# Patient Record
Sex: Female | Born: 1985 | Hispanic: Yes | Marital: Married | Smoking: Never smoker
Health system: Southern US, Community
[De-identification: ages and names within clinical notes are randomized; demographics above are authoritative.]

## PROBLEM LIST (undated history)

## (undated) DIAGNOSIS — K37 Unspecified appendicitis: Secondary | ICD-10-CM

## (undated) HISTORY — DX: Unspecified appendicitis: K37

---
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
## 2014-10-11 DIAGNOSIS — L709 Acne, unspecified: Secondary | ICD-10-CM

## 2014-10-11 DIAGNOSIS — H52211 Irregular astigmatism, right eye: Secondary | ICD-10-CM | POA: Insufficient documentation

## 2014-10-11 HISTORY — DX: Acne, unspecified: L70.9

## 2015-02-08 DIAGNOSIS — R946 Abnormal results of thyroid function studies: Secondary | ICD-10-CM

## 2015-02-08 HISTORY — DX: Abnormal results of thyroid function studies: R94.6

## 2015-04-15 DIAGNOSIS — H5203 Hypermetropia, bilateral: Secondary | ICD-10-CM | POA: Insufficient documentation

## 2015-04-15 DIAGNOSIS — H5310 Unspecified subjective visual disturbances: Secondary | ICD-10-CM

## 2015-04-15 HISTORY — DX: Hypermetropia, bilateral: H52.03

## 2015-04-15 HISTORY — DX: Unspecified subjective visual disturbances: H53.10

## 2015-05-10 DIAGNOSIS — D171 Benign lipomatous neoplasm of skin and subcutaneous tissue of trunk: Secondary | ICD-10-CM

## 2015-05-10 DIAGNOSIS — L918 Other hypertrophic disorders of the skin: Secondary | ICD-10-CM | POA: Insufficient documentation

## 2015-05-10 HISTORY — DX: Other hypertrophic disorders of the skin: L91.8

## 2015-05-10 HISTORY — DX: Benign lipomatous neoplasm of skin and subcutaneous tissue of trunk: D17.1

## 2015-05-16 DIAGNOSIS — H53009 Unspecified amblyopia, unspecified eye: Secondary | ICD-10-CM | POA: Insufficient documentation

## 2015-09-06 DIAGNOSIS — K644 Residual hemorrhoidal skin tags: Secondary | ICD-10-CM

## 2015-09-06 HISTORY — DX: Residual hemorrhoidal skin tags: K64.4

## 2016-03-14 DIAGNOSIS — H52222 Regular astigmatism, left eye: Secondary | ICD-10-CM | POA: Insufficient documentation

## 2017-06-18 ENCOUNTER — Other Ambulatory Visit: Payer: Self-pay

## 2017-06-18 ENCOUNTER — Ambulatory Visit (INDEPENDENT_AMBULATORY_CARE_PROVIDER_SITE_OTHER): Payer: Medicaid Other | Admitting: Family Medicine

## 2017-06-18 VITALS — BP 96/68 | HR 87 | Temp 98.7°F | Ht 68.0 in | Wt 146.0 lb

## 2017-06-18 DIAGNOSIS — Z7689 Persons encountering health services in other specified circumstances: Secondary | ICD-10-CM

## 2017-06-18 NOTE — Progress Notes (Signed)
   Subjective:   Patient ID: Margaret [name]    DOB: [birth year], 32 y.o. female   MRN: [id number]  CC: new patient   HPI: Margaret [name] is a 32 y.o. female who presents to clinic today to establish care with pcp.    Patient has moved to [place] from [place].  She lives at home with her two children, (ages 94 and 62).   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  No current concerns at this visit.    Seizure disorder:  Diagnosed 2012. She takes Lamictal 150 mg BID.  Her Neurologist is in [place], has not yet established with one here.  She has not had any episodes since starting medication.    -PMH: seizure disorder -Surg Hx: appendectomy 2001  -FHx:  Mother: diabetes, HTN.  Father: HTN, HLD and ?kidney disease.  Grandparent with h/o hepatitis C, depression, HTN, thyroid disease, and T2DM.   -Social Hx: No tobacco use, denies alcohol or illicit drug use.  -Meds: Lamictal   Allergies: no known    ROS: Denies fever, chills, nausea, vomiting.  No SOB, CP, palpitations, leg swelling  Social: pt is a never smoker  Medications reviewed.  Objective:   BP 96/68   Pulse 87   Temp 98.7 F (37.1 C) (Oral)   Ht 5\' 8"  (1.727 m)   Wt 146 lb (66.2 kg)   LMP 05/22/2017   SpO2 99%   BMI 22.20 kg/m  Vitals and nursing note reviewed.  General: well-appearing 33 yo female, NAD  HEENT: NCAT, EOMI, PERRL  Neck: supple, nontender, no JVD  CV: RRR no MRG  Lungs: CTAB normal effort  Abdomen: soft, NTND, +bs  Skin: warm, dry, no rash  Extremities: warm and well perfused   Assessment & Plan:   32 yo female presenting to establish care.  Past medical, family history and surgical history reviewed.  Medication list reviewed and health maintenance issues discussed.   She is to follow up in 1-2 weeks to address additional concern.    Health Maintenance:  -Last Pap 2018- normal; no h/o abnormal paps in the past  -HIV declined, had it checked in [place] 2012 and negative  -received  her tetanus shot last month due to immigration    Follow-up:  1-2 weeks   Lovenia Kim, MD [place], PGY-2 06/24/2017 2:38 PM

## 2017-06-18 NOTE — Patient Instructions (Signed)
It was nice meeting you today.   You were seen in clinic to establish care with me as your primary care provider.  We reviewed your medical history, surgical and family history as well as medications.  We also discussed health maintenance issues such as Pap smear and screening tests.  Please make an appointment in about a week or so to address additional concerns.  Be well, Lovenia Kim MD

## 2017-06-28 ENCOUNTER — Ambulatory Visit: Payer: Self-pay | Admitting: Family Medicine

## 2017-07-09 ENCOUNTER — Ambulatory Visit: Payer: Self-pay | Admitting: Family Medicine

## 2017-07-09 NOTE — Progress Notes (Deleted)
   Subjective:   Patient ID: Margaret [name]    DOB: [birth year], 32 y.o. female   MRN: [id number]  CC: ***  HPI: Margaret [name] is a 32 y.o. female who presents to clinic today ***. Problems discussed today are as follows:  ROS: See HPI for pertinent ROS.  [place]: Pertinent past medical, surgical, family, and social history were reviewed and updated as appropriate. Smoking status reviewed.  Medications reviewed.  Objective:   There were no vitals taken for this visit. Vitals and nursing note reviewed.  General: well nourished, well developed, in no acute distress with non-toxic appearance HEENT: normocephalic, atraumatic, moist mucous membranes Neck: supple, non-tender without lymphadenopathy CV: regular rate and rhythm without murmurs rubs or gallops Lungs: clear to auscultation bilaterally with normal work of breathing Abdomen: soft, non-tender, no masses or organomegaly palpable, normoactive bowel sounds Skin: warm, dry, no rashes or lesions, cap refill < 2 seconds Extremities: warm and well perfused, normal tone  Assessment & Plan:   No problem-specific Assessment & Plan notes found for this encounter.  No orders of the defined types were placed in this encounter.  No orders of the defined types were placed in this encounter.   Lovenia Kim, MD [place], PGY-2 07/09/2017 11:30 AM

## 2017-07-23 ENCOUNTER — Ambulatory Visit (INDEPENDENT_AMBULATORY_CARE_PROVIDER_SITE_OTHER): Payer: Medicaid Other | Admitting: Family Medicine

## 2017-07-23 ENCOUNTER — Encounter: Payer: Self-pay | Admitting: Family Medicine

## 2017-07-23 DIAGNOSIS — M25562 Pain in left knee: Secondary | ICD-10-CM | POA: Diagnosis present

## 2017-07-23 MED ORDER — NAPROXEN 250 MG PO TABS: 250.0000 mg | ORAL_TABLET | Freq: Two times a day (BID) | ORAL | 0 refills | Status: DC

## 2017-07-23 NOTE — Assessment & Plan Note (Signed)
Currently asymptomatic.  Patient reports clicking occurs mainly during winter months.  No swelling, erythema, or obvious joint deformity on exam. No suspicion for meniscal tear given negative McMurrays and otherwise normal MSK exam.  Low suspicion for fracture given no history of trauma.  Recommend anti-inflammatory, rest and elevation, ice application.  Will defer imaging for now as do not expect osteoarthritis given her age and no recent knee or other comorbidities. Discussed with pt and she is agreeable to conservative management.  Will continue to monitor.

## 2017-07-23 NOTE — Patient Instructions (Addendum)
It was nice seeing you again today!  You were seen in clinic for left knee pain.  As we discussed, I do not suspect arthritis given your age and otherwise healthy medical history. I also do not suspect a meniscal or ligament tear as your physical exam was normal.   I would recommend continuing to use a compression sleeve over the left knee, applying ice, elevating the knee and taking anti-inflammatories as needed.  I have prescribed you Naprosyn and you can take this twice a day as needed.  This should not prevent you from any activity and you can continue to exercise as usual.  Please call clinic if you have any questions.  We will follow up with you in 4-6 weeks or sooner if needed.   Be well, Lovenia Kim MD

## 2017-07-23 NOTE — Progress Notes (Signed)
   Subjective:   Patient ID: Margaret [name]    DOB: 1986-01-20, 32 y.o. female   MRN: [id number]  CC: L knee pain   HPI: Margaret [name] is a 32 y.o. female who presents to clinic today for the following issue.  L knee pain Patient notes her left knee pain first started December/January 2019.  She denies history of fall or injury to the knee prior to onset but had been on her feet a lot for work around the time her pain started.  She works at [employer] and walks around all day for 2 months, October through December.  She is no longer working now but suspects her symptoms at the time were exacerbated with work.  She reports she hears a clicking sound.  No popping or cracking sensation and knee.  Usually happens in colder weather and is not currently bothering her as the weather is warmer.  No weakness noted she does not feel like her knee gives out.  She has not previously had any issues with her knees or other joints.  She has not been taking any anti-inflammatories for the pain.  She uses a compression sleeve which helped.  No ice or heat application.  No leg swelling or stiffness reported.  No fever, chills, nausea, vomiting.  No numbness or tingling.  No weakness noted.   ROS: See HPI for pertinent ROS.  Social: Patient is a never smoker Medications reviewed. Objective:   BP 95/70 (BP Location: Left Arm, Patient Position: Sitting, Cuff Size: Normal)   Pulse 77   Temp 98.5 F (36.9 C) (Oral)   Ht 5\' 8"  (1.727 m)   Wt 148 lb 3.2 oz (67.2 kg)   SpO2 99%   BMI 22.53 kg/m  Vitals and nursing note reviewed.  General: 32 year old female, NAD CV: RRR no MRG, 2+ pedal pulses  Lungs: CTA B, normal effort Skin: warm, dry, no rash Extremities: warm and well perfused, normal tone  MSK - Left knee: Normal to inspection with no erythema or effusion or obvious bony abnormalities. Palpation normal with no warmth or joint line tenderness or patellar tenderness or condyle tenderness. ROM normal in flexion  and extension and lower leg rotation. Ligaments with solid consistent endpoints including ACL, PCL, LCL, MCL. Negative Mcmurray's and provocative meniscal tests. Non painful patellar compression. Patellar and quadriceps tendons unremarkable. Hamstring and quadriceps strength is normal.  Assessment & Plan:   Left knee pain Currently asymptomatic.  Patient reports clicking occurs mainly during winter months.  No swelling, erythema, or obvious joint deformity on exam. No suspicion for meniscal tear given negative McMurrays and otherwise normal MSK exam.  Low suspicion for fracture given no history of trauma.  Recommend anti-inflammatory, rest and elevation, ice application.  Will defer imaging for now as do not expect osteoarthritis given her age and no recent knee or other comorbidities. Discussed with pt and she is agreeable to conservative management.  Will continue to monitor.    Meds ordered this encounter  Medications  . naproxen (NAPROSYN) 250 MG tablet    Sig: Take 1 tablet (250 mg total) by mouth 2 (two) times daily with a meal.    Dispense:  60 tablet    Refill:  0    Lovenia Kim, MD [place], PGY-2 07/23/2017 5:01 PM

## 2017-07-30 ENCOUNTER — Ambulatory Visit: Payer: Medicaid Other | Admitting: Internal Medicine

## 2017-07-30 ENCOUNTER — Other Ambulatory Visit: Payer: Self-pay

## 2017-07-30 ENCOUNTER — Encounter: Payer: Self-pay | Admitting: Internal Medicine

## 2017-07-30 VITALS — BP 106/70 | HR 78 | Temp 98.0°F | Wt 144.0 lb

## 2017-07-30 DIAGNOSIS — G40909 Epilepsy, unspecified, not intractable, without status epilepticus: Secondary | ICD-10-CM | POA: Insufficient documentation

## 2017-07-30 DIAGNOSIS — B349 Viral infection, unspecified: Secondary | ICD-10-CM

## 2017-07-30 MED ORDER — IBUPROFEN 600 MG PO TABS: 600.0000 mg | ORAL_TABLET | Freq: Three times a day (TID) | ORAL | 0 refills | Status: DC | PRN

## 2017-07-30 NOTE — Progress Notes (Signed)
   [facility] Phone: [phone]   Date of Visit: 07/30/2017   HPI:  Sore Throat:  - sore throat started Thursday night. She went to urgent care on Friday. Her strep test was negative there. She was given throat numbing medication which helped. Her symptoms improved but worsened yesterday. The numbing medication still helps some - she also feels tired and dizzy when going from a seated to a standing position. She is a cough with yellow sputum that improves with over the counter cough medications.   - she has been eating snacks through out the day. Had not been drinking as much fluid.  - reports ibuprofen helps with the sore throat as well.  - denies having fevers  - reports her children have been sick with fever for the past three days but have not complained about sore throat.   ROS: See HPI.  [place]:  PMH: Seizure disorder   PHYSICAL EXAM: BP 106/70   Pulse 78   Temp 98 F (36.7 C) (Oral)   Wt 144 lb (65.3 kg)   LMP 07/24/2017   SpO2 98%   BMI 21.90 kg/m  GEN: NAD HEENT: neck supple without lymphadenopathy , EOMI, sclera clear, pharynx with mild erythema but no asymmetry or exudates, uvula is midline.  CV: RRR, no murmurs, rubs, or gallops PULM: CTAB, normal effort SKIN: No rash or cyanosis; warm and well-perfused EXTR: No lower extremity edema or calf tenderness PSYCH: Mood and affect euthymic, normal rate and volume of speech NEURO: Awake, alert, no focal deficits grossly, normal speech  ASSESSMENT/PLAN:  1. Viral syndrome Symptoms are likely due to viral infection, likely Infectious Mononucleosis. No signs of bacterial infection/pneumonia. Vitals are stable and patient is afebrile. Discussed symptomatic management and return precautions.   Smiley Houseman, MD PGY [place]

## 2017-07-30 NOTE — Patient Instructions (Signed)
I think your symptoms are still consistent with a virus, possibly infectious mononucleosis. Keep yourself hydrated.

## 2017-07-31 ENCOUNTER — Encounter: Payer: Self-pay | Admitting: Internal Medicine

## 2017-09-12 ENCOUNTER — Other Ambulatory Visit: Payer: Self-pay | Admitting: Internal Medicine

## 2018-04-23 ENCOUNTER — Ambulatory Visit: Payer: Medicaid Other | Admitting: Family Medicine

## 2019-04-24 ENCOUNTER — Encounter: Payer: Self-pay | Admitting: Family Medicine

## 2019-04-24 ENCOUNTER — Other Ambulatory Visit: Payer: Self-pay

## 2019-04-24 ENCOUNTER — Ambulatory Visit (INDEPENDENT_AMBULATORY_CARE_PROVIDER_SITE_OTHER): Payer: Medicaid Other | Admitting: Family Medicine

## 2019-04-24 VITALS — BP 100/80 | HR 89 | Wt 155.2 lb

## 2019-04-24 DIAGNOSIS — F329 Major depressive disorder, single episode, unspecified: Secondary | ICD-10-CM

## 2019-04-24 DIAGNOSIS — R5383 Other fatigue: Secondary | ICD-10-CM

## 2019-04-24 DIAGNOSIS — F32A Depression, unspecified: Secondary | ICD-10-CM

## 2019-04-24 DIAGNOSIS — F419 Anxiety disorder, unspecified: Secondary | ICD-10-CM | POA: Diagnosis not present

## 2019-04-24 NOTE — Progress Notes (Signed)
    SUBJECTIVE:   CHIEF COMPLAINT / HPI: Clearance for tooth extraction  Tooth extraction: Reports she needs her for posterior molars removed, will be done under local anesthesia.  She want to make sure that this was okay due to her history of epilepsy which is well controlled on lamotrigine.  Has not had a seizure since 2012.  Has had no difficulties with anesthesia in the past.  No cardiac history or prosthetic valves.  Feeling down: Reports she has noticed for the last several weeks that she has a shorter fuse with things that irritate her.  Noticing this all the time, however worse the week before her menstrual cycle.  She used to exercise before the pandemic however has not since the closing of gyms.  Feels like she does not have much time to herself, all of her time was devoted to her 2 children and husband.  She is feeling down and fatigued, not wanting to do the things she previously did.  Reports nothing in particular has happened recently, does feel down in general due to the duration of the pandemic.  Feels safe at home.  Would like a medication to relax her.  PHQ-9 score of 9, no SI or HI GAD-7 score of 7 Both somewhat difficult for her to handle.  Health maintenance: Due for HIV screening, Tdap, flu shot, and Pap smear  PERTINENT  PMH / PSH: Seizure disorder  OBJECTIVE:   BP 100/80   Pulse 89   Wt 155 lb 3.2 oz (70.4 kg)   SpO2 100%   BMI 23.60 kg/m   General: Alert, NAD HEENT: NCAT, MMM Cardiac: RRR no m/g/r Lungs: Clear bilaterally, no increased WOB  Msk: Moves all extremities spontaneously  Ext: Warm, dry Psych: Melancholic mood and affect.  Able to hold normal conversation, nontangential.  ASSESSMENT/PLAN:   Anxiety and depression Mild, may also have component of premenstrual syndrome due to worsening the week prior to menses.  Discussed starting counseling which the patient is receptive to, provided with resources.  Additionally discussed trying to stay active,  following a well-balanced diet, and finding an outlet to enjoy some alone time.  Obtaining a TSH and CBC to ensure thyroid dysfunction or anemia is not contributing given her previous history.  Will have her follow-up in approximately 2 weeks to check in, will discuss further relaxation techniques at that time.  Could consider medications in the future if not improving.    Tooth extraction clearance Cleared from a medical perspective for tooth extraction.  Will send letter to her dentist to proceed without any prophylactic antibiotics.  Healthcare maintenance: Reports she had a Pap smear 1 year ago in [place] that was normal.  She will be getting these records sent to [place].  Follow-up in 2 weeks or sooner if needed.  [name], [place]

## 2019-04-24 NOTE — Patient Instructions (Addendum)
It was so wonderful meeting you today.  Below are some therapy resources that you can get connected with to help with how you are feeling.  We are going to go ahead and check your blood level and TSH today just to make sure these are not contributing.    Therapy and Counseling Resources Most providers on this list will take Medicaid. Patients with commercial insurance or Medicare should contact their insurance company to get a list of in network providers.  [facility]  [address], [place], [place] [address]      [phone]  [place] [address] [place], [place] [address]       [place] [address] [place], [place]    [facility] [address] [place], [place]  [facility]:  [place]. [place]  [facility]:  [place] [address] Loni Muse, [place]  [facility] (under & uninsured) [address], [place] [phone]    [email or web address]     Phone:  [phone]     [facility]  [place] [address]      [place], [place] ext [place]: [place], [place], [place]   [place] ([place] therapist) [address] [place] [address]   [place] [address]    [phone]    [facility]   [place],  [place], [place]   [place]  [place]  [facility]  [address] [place], [place]        [phone]  Open Access/Walk In Clinic under & uninsured [place],  [address], [place] [phone]):  Mon - Fri from 8 AM - 3 PM  [facility]  Service of the [place],  ([place])   [place], [place]: [phone]) 8:30 - 12; 1 - 2:30  Family Service of the [place],  [place], [place]    ([phone]):8:30 - 12; 2 - 3PM  [facility],  [address],  [place]; [phone]):   Mon - Fri 8 AM - 5 PM  [facility] [place]  MWF 12:30 to 3:00 or call to schedule an appointment  [phone]  Specific Provider options Psychology Today  [email or web address].com/us 1. click on find a therapist  2. enter your zip code 3. left side and select or tailor a therapist for your specific need.   [facility] Provider Directory [email or web address]  (Medicaid)   Follow all drop down to find a provider  [place] or [email or web address] [address], [place], [place] Recovery support and educational   In home counseling [place] Telephone: [phone]  office in [facility] [email or web address]   Does not take reg. Medicaid or Medicare private insurance [facility], [place] health Choice, [facility], [place], [place], [place], [place] Health Choice  24- Hour Availability:  . [place] or [phone]  . Family Service of the [employer] [phone]  [facility] Crisis  Service  [phone]   . [place]  [phone] (after hours)  . Therapeutic Alternative/Mobile Crisis   [phone]  . [place] National Suicide Hotline  920-353-0353 ([place])  . Call 911 or go to emergency room  . [employer]  [phone]);  [place] and [employer]   . [facility] ACCESS  [phone]); [place], [place], [place], [place], [place], [place], [place]

## 2019-04-24 NOTE — Assessment & Plan Note (Addendum)
Mild, may also have component of premenstrual syndrome due to worsening the week prior to menses.  Discussed starting counseling which the patient is receptive to, provided with resources.  Additionally discussed trying to stay active, following a well-balanced diet, and finding an outlet to enjoy some alone time.  Obtaining a TSH and CBC to ensure thyroid dysfunction or anemia is not contributing given her previous history.  Will have her follow-up in approximately 2 weeks to check in, will discuss further relaxation techniques at that time.  Could consider medications in the future if not improving.

## 2019-04-25 LAB — CBC
Hematocrit: 37.7 % (ref 34.0–46.6)
Hemoglobin: 12.5 g/dL (ref 11.1–15.9)
MCH: 28.7 pg (ref 26.6–33.0)
MCHC: 33.2 g/dL (ref 31.5–35.7)
MCV: 87 fL (ref 79–97)
Platelets: 264 10*3/uL (ref 150–450)
RBC: 4.35 x10E6/uL (ref 3.77–5.28)
RDW: 13.5 % (ref 11.7–15.4)
WBC: 5.4 10*3/uL (ref 3.4–10.8)

## 2019-04-25 LAB — TSH: TSH: 6.03 u[IU]/mL — ABNORMAL HIGH (ref 0.450–4.500)

## 2019-04-27 ENCOUNTER — Other Ambulatory Visit: Payer: Self-pay | Admitting: Family Medicine

## 2019-04-27 ENCOUNTER — Telehealth: Payer: Self-pay | Admitting: Family Medicine

## 2019-04-27 DIAGNOSIS — R7989 Other specified abnormal findings of blood chemistry: Secondary | ICD-10-CM

## 2019-04-27 NOTE — Telephone Encounter (Signed)
Pt is calling saying she is needing to schedule appt, but she cant because her clearance papers aren't signed and faxed over, she is saying she handed them to the doctor on Friday at appt. Please advise thanks.!

## 2019-04-28 ENCOUNTER — Encounter: Payer: Self-pay | Admitting: Family Medicine

## 2019-04-28 LAB — SPECIMEN STATUS REPORT

## 2019-04-28 LAB — T4, FREE: Free T4: 1.28 ng/dL (ref 0.82–1.77)

## 2019-04-28 NOTE — Telephone Encounter (Signed)
I faxed the clearance papers yesterday, 3/15. Please let the patient know she should be good to go to schedule an appointment.   Thank you! [name], DO

## 2019-05-01 NOTE — Telephone Encounter (Signed)
Called and LVM for patient informing her that clearance papers were faxed on 04/27/2019.  Ozella Almond, [place]

## 2019-06-19 ENCOUNTER — Encounter: Payer: Self-pay | Admitting: Family Medicine

## 2019-06-19 ENCOUNTER — Other Ambulatory Visit: Payer: Self-pay

## 2019-06-19 ENCOUNTER — Ambulatory Visit: Payer: Medicaid Other | Admitting: Family Medicine

## 2019-06-19 VITALS — BP 106/62 | HR 66 | Wt 152.2 lb

## 2019-06-19 DIAGNOSIS — Z3169 Encounter for other general counseling and advice on procreation: Secondary | ICD-10-CM

## 2019-06-19 DIAGNOSIS — G40909 Epilepsy, unspecified, not intractable, without status epilepticus: Secondary | ICD-10-CM | POA: Diagnosis not present

## 2019-06-19 NOTE — Patient Instructions (Signed)
It was wonderful to see you.  I have placed a referral for neurology and OB/GYN, you should hear from them within the next 1-2 weeks.  If you do not, please call the clinic and let us know.

## 2019-06-19 NOTE — Progress Notes (Signed)
    SUBJECTIVE:   CHIEF COMPLAINT / HPI: Discuss referral to neurologist  Margaret [name] is a 34 year old female presenting to discuss a neurology referral.  She has a history of epilepsy and has been on lamotrigine for several years.  Stable on this medication, has not had a seizure since 2012.  She has been following with her neurologist yearly in [place] (previously lived there) and feels that it is time to establish care with neurology locally instead.  During our conversation, also requests a referral to OB/GYN to discuss infertility.  States she and her husband have been "passively" trying for third child for over a year.  However, states she also is not sure if she wants another child unless it is 100% that she would have another son. She has taken ovulation kits at home which have confirmed that she is ovulating.  They have not been exclusively trying during ovulation phases. Having regular monthly cycles, usually last about a week.  Had no trouble with her first 2 children.  No history of tubal ligation or vasectomy.  No history of uterine instrumentation.  Health maintenance: Due for HIV screening.  Reports Pap smear in [place] within the last year, unsure at where to obtain records.   PERTINENT  PMH / PSH: Seizure disorder, anxiety/depression  OBJECTIVE:   BP 106/62   Pulse 66   Wt 152 lb 3.2 oz (69 kg)   LMP 06/18/2019   SpO2 99%   BMI 23.14 kg/m   General: Alert, NAD HEENT: NCAT, MMM Cardiac: RRR no m/g/r Lungs: No increased WOB  Msk: Moves all extremities spontaneously  Ext: Warm, dry   ASSESSMENT/PLAN:   Seizure disorder (HCC) Stable on lamotrigine, no seizure since 2012.  Will place non-urgent neurology referral per patient request to establish care locally.    Concern for infertility: G2P2 female, no previous difficulties with conception after >1 year.  Reports + ovulation via home kits.  Recent subclinical hypothyroidism on labs in 04/2019, doubt this is contributing.  During  conversation, patient reveals she is unsure if she wants further children, especially if it cannot be guaranteed that she will have a son.  Discussed having further conversation with her husband and will place a referral to OB/GYN in the meantime if they wish to proceed with more in-depth evaluation.  Educated on proper intercourse timing around ovulation.  F/u as needed, otherwise yearly.   Margaret [name], [place]

## 2019-06-22 ENCOUNTER — Encounter: Payer: Self-pay | Admitting: Family Medicine

## 2019-06-22 NOTE — Assessment & Plan Note (Signed)
Stable on lamotrigine, no seizure since 2012.  Will place non-urgent neurology referral per patient request to establish care locally.

## 2019-06-24 ENCOUNTER — Ambulatory Visit: Payer: Medicaid Other | Admitting: Neurology

## 2019-09-30 ENCOUNTER — Encounter: Payer: Self-pay | Admitting: Neurology

## 2019-09-30 ENCOUNTER — Other Ambulatory Visit: Payer: Self-pay

## 2019-09-30 ENCOUNTER — Ambulatory Visit (INDEPENDENT_AMBULATORY_CARE_PROVIDER_SITE_OTHER): Payer: Medicaid Other | Admitting: Neurology

## 2019-09-30 VITALS — BP 107/68 | HR 85 | Ht 68.0 in | Wt 155.0 lb

## 2019-09-30 DIAGNOSIS — R454 Irritability and anger: Secondary | ICD-10-CM | POA: Diagnosis not present

## 2019-09-30 DIAGNOSIS — G40309 Generalized idiopathic epilepsy and epileptic syndromes, not intractable, without status epilepticus: Secondary | ICD-10-CM

## 2019-09-30 MED ORDER — LAMOTRIGINE 150 MG PO TABS
150.0000 mg | ORAL_TABLET | Freq: Two times a day (BID) | ORAL | 3 refills | Status: DC
Start: 2019-09-30 — End: 2020-08-23

## 2019-09-30 NOTE — Progress Notes (Signed)
NEUROLOGY CONSULTATION NOTE  Ashyah Quizon MRN: [id number] DOB: August 12, 1985  Referring provider: Dr. Darrelyn Hillock Primary care provider: Dr. Darrelyn Hillock  Reason for consult:  Establish care for seizures  Dear Dr Higinio Plan:  Thank you for your kind referral of Rhyanna Sorce for consultation of the above symptoms. Although her history is well known to you, please allow me to reiterate it for the purpose of our medical record. She is alone in the office today. Records and images were personally reviewed where available.   HISTORY OF PRESENT ILLNESS: This is a 34 year old right-handed woman presenting to establish care for epilepsy. Seizures started in 2003/2004 while she was still living in [place]. There was no prior warning, she recalls cooking, then waking up on the bed. She was told she had full body shaking and was staring for a long time after. She slept all day afterwards. She was unable to seek medical care at that time. She had another seizure in 2006 while at [place] in [place], she was sitting then suddenly had a GTC. The next seizure occurred in 2010 when she was studying in the [place]. An EEG and MRI were recommended, she had these studies in [place] and was told MRI brain was fine but the EEG showed abnormal activity. She was started on Levetiracetam in [place] but had another seizure and was started on Lamotrigine 150mg  BID in 2012. She denies any further convulsions since then. No side effects on lamotrigine. She lives with her husband and 2 children (ages 21 and 27), and denies any staring/unresponsive episodes, gaps in time, olfactory/gustatory hallucinations. She has myoclonic jerks affecting her head when she is stressed out, sleep deprived, but most noticeable when she is concentrating a lot on her laptop. Extremities are not affected. She recalls prior to one of her seizures, she had a head jerk, then 5-10 seconds later she had a GTC and woke up in the hospital. Last time she recalls  having a head jerk was in Jan/Feb 2021. She denies any focal numbness/tingling/weakness. Once or twice she woke up with blood on her pillow, her husband has not mentioned any nocturnal convulsions. She has headaches around the time of her period. She has occasional neck pain, occasional constipation. She has noticed for the past couple of months, she gets angry easily. She has a history of being short-tempered but now little things happen and she screams at her children. Sleep is good. She denies any dizziness, diplopia, dysarthria/dysphagia, back pain, bladder dysfunction.She works as a Community education officer at [employer]. No current pregnancy plans, she is not on contraception.   Epilepsy Risk Factors:  Her maternal uncle and maternal first cousin have seizures. Otherwise she had a normal birth and early development.  There is no history of febrile convulsions, CNS infections such as meningitis/encephalitis, significant traumatic brain injury, neurosurgical procedures.  Prior AEDs: Levetiracetam  Diagnostic Data: Patient reports having a repeat abnormal EEG done in 2016, records will be requested from Dr. Pattricia Boss in [facility].   PAST MEDICAL HISTORY: Past Medical History:  Diagnosis Date   Appendicitis     PAST SURGICAL HISTORY: Past Surgical History:  Procedure Laterality Date   APPENDECTOMY  2001    MEDICATIONS: Current Outpatient Medications on File Prior to Visit  Medication Sig Dispense Refill   ibuprofen (ADVIL,MOTRIN) 600 MG tablet TAKE 1 TABLET BY MOUTH EVERY 8 HOURS AS NEEDED 15 tablet 0   lamoTRIgine (LAMICTAL) 150 MG tablet Take 150 mg by mouth 2 (two) times daily.  No current facility-administered medications on file prior to visit.    ALLERGIES: No Known Allergies  FAMILY HISTORY: History reviewed. No pertinent family history.  SOCIAL HISTORY: Social History   Socioeconomic History   Marital status: Married    Spouse name: Not on file   Number of children: 2   Years of  education: Not on file   Highest education level: Not on file  Occupational History   Not on file  Tobacco Use   Smoking status: Never Smoker   Smokeless tobacco: Never Used  Vaping Use   Vaping Use: Never used  Substance and Sexual Activity   Alcohol use: Never   Drug use: Never   Sexual activity: Not on file  Other Topics Concern   Not on file  Social History Narrative   Right Handed   One Story Home   Drinks caffeine   Social Determinants of Health   Financial Resource Strain:    Difficulty of Paying Living Expenses:   Food Insecurity:    Worried About Charity fundraiser in the Last Year:    Arboriculturist in the Last Year:   Transportation Needs:    Film/video editor (Medical):    Lack of Transportation (Non-Medical):   Physical Activity:    Days of Exercise per Week:    Minutes of Exercise per Session:   Stress:    Feeling of Stress :   Social Connections:    Frequency of Communication with Friends and Family:    Frequency of Social Gatherings with Friends and Family:    Attends Religious Services:    Active Member of Clubs or Organizations:    Attends Music therapist:    Marital Status:   Intimate Partner Violence:    Fear of Current or Ex-Partner:    Emotionally Abused:    Physically Abused:    Sexually Abused:     PHYSICAL EXAM: Vitals:   09/30/19 1308  BP: 107/68  Pulse: 85  SpO2: 100%   General: No acute distress Head:  Normocephalic/atraumatic Skin/Extremities: No rash, no edema Neurological Exam: Mental status: alert and oriented to person, place, and time, no dysarthria or aphasia, Fund of knowledge is appropriate.  Recent and remote memory are intact. 3/3 delayed recall. Attention and concentration are normal.     Cranial nerves: CN I: not tested CN II: pupils equal, round and reactive to light, visual fields intact CN III, IV, VI:  full range of motion, no nystagmus, no ptosis CN V: facial  sensation intact CN VII: upper and lower face symmetric CN VIII: hearing intact to conversation Bulk & Tone: normal, no fasciculations. Motor: 5/5 throughout with no pronator drift. Sensation: intact to light touch, cold, pin, vibration sense.  No extinction to double simultaneous stimulation.  Romberg test negative Deep Tendon Reflexes: +2 throughout Cerebellar: no incoordination on finger to nose testing Gait: narrow-based and steady, able to tandem walk adequately. 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 She reports having an abnormal EEG in 2010 and most recently in 2016. Records from her prior neurologist in [facility] will be requested for review. She reports having a normal MRI brain. Her last GTC was in 2012. No side effects on Lamotrigine 150mg  BID, refills sent. We discussed issues in women with epilepsy, no current pregnancy plans.  driving laws were discussed with the patient, and she  knows to stop driving after a seizure, until 6 months seizure-free. She is reporting anger/irritability and would like to proceed with psychotherapy, referral will be sent. Follow-up in 1 year, she knows to call for any changes.   Thank you for allowing me to participate in the care of this patient. Please do not hesitate to call for any questions or concerns.   Ellouise Newer, M.D.  CC: Dr. Higinio Plan

## 2019-09-30 NOTE — Patient Instructions (Addendum)
1. Continue Lamictal 150mg  twice a day  2. Records from Dr. Elisabeth Pigeon will be requested for review  3. Refer to [facility] for counseling  4. Follow-up in 1 year, call for any changes   Seizure Precautions: 1. If medication has been prescribed for you to prevent seizures, take it exactly as directed.  Do not stop taking the medicine without talking to your doctor first, even if you have not had a seizure in a long time.   2. Avoid activities in which a seizure would cause danger to yourself or to others.  Don't operate dangerous machinery, swim alone, or climb in high or dangerous places, such as on ladders, roofs, or girders.  Do not drive unless your doctor says you may.  3. If you have any warning that you may have a seizure, lay down in a safe place where you can't hurt yourself.    4.  No driving for 6 months from last seizure, as per [facility].   Please refer to the following link on the [place] website for more information: http://[email or web address].org/[email or web address]/driving/drivingu.cfm   5.  Maintain good sleep hygiene. Avoid alcohol.  6.  Notify your neurology if you are planning pregnancy or if you become pregnant.  7.  Contact your doctor if you have any problems that may be related to the medicine you are taking.  8.  Call 911 and bring the patient back to the ED if:        A.  The seizure lasts longer than 5 minutes.       B.  The patient doesn't awaken shortly after the seizure  C.  The patient has new problems such as difficulty seeing, speaking or moving  D.  The patient was injured during the seizure  E.  The patient has a temperature over 102 F (39C)  F.  The patient vomited and now is having trouble breathing

## 2019-10-07 ENCOUNTER — Other Ambulatory Visit: Payer: Self-pay

## 2019-10-07 ENCOUNTER — Ambulatory Visit (INDEPENDENT_AMBULATORY_CARE_PROVIDER_SITE_OTHER): Payer: Medicaid Other | Admitting: Family Medicine

## 2019-10-07 ENCOUNTER — Encounter: Payer: Self-pay | Admitting: Family Medicine

## 2019-10-07 VITALS — BP 114/60 | HR 89 | Wt 153.0 lb

## 2019-10-07 DIAGNOSIS — R1084 Generalized abdominal pain: Secondary | ICD-10-CM

## 2019-10-07 DIAGNOSIS — K5909 Other constipation: Secondary | ICD-10-CM | POA: Diagnosis not present

## 2019-10-07 DIAGNOSIS — K5900 Constipation, unspecified: Secondary | ICD-10-CM

## 2019-10-07 MED ORDER — POLYETHYLENE GLYCOL 3350 17 GM/SCOOP PO POWD: 17.0000 g | Freq: Two times a day (BID) | ORAL | 1 refills | Status: DC | PRN

## 2019-10-07 MED ORDER — IBUPROFEN 600 MG PO TABS: 600.0000 mg | ORAL_TABLET | Freq: Three times a day (TID) | ORAL | 0 refills | Status: DC | PRN

## 2019-10-07 MED ORDER — SIMETHICONE 125 MG PO CHEW: 125.0000 mg | CHEWABLE_TABLET | Freq: Four times a day (QID) | ORAL | 0 refills | Status: DC | PRN

## 2019-10-07 MED ORDER — SENNA 8.6 MG PO TABS: 1.0000 | ORAL_TABLET | Freq: Every day | ORAL | 0 refills | Status: DC | PRN

## 2019-10-07 NOTE — Progress Notes (Signed)
   SUBJECTIVE:   CHIEF COMPLAINT / HPI:   Chief Complaint  Patient presents with  . Constipation  . Gas     Margaret [name] is a 34 y.o. female here for generalized abdominal pain that radiates towards her chest.  Patient had appendectomy in 2001. Has hx of kidney stones. No known hx of pancreatitis.  Denies alcohol use.  Reports similar pain about 2 years ago after eating mozzarella.  States she ate mozzarella and eggs on Monday and then the pain started the next day.  She tried Gas-X with minimal relief.  Patient is also menstruating reports abdominal pain and low back pain that can happen with her periods.  Endorses burping, constipation and small amount of watery diarrhea yesterday.  Denies hematuria, hematochezia, melena, vomiting.   ABDOMINAL PAIN: Location: generalized  Onset: Monday   Radiation: lower back Severity: 10/10 Quality: period.   Duration: waxes and wanes  Better after having a stool.  Worse after eating    Symptoms Nausea/Vomiting: no  Diarrhea: yes  Constipation: yes  Melena/BRBPR: no  Hematemesis: no  Anorexia: yes  Fever/Chills: no  Dysuria: no  Rash: no  Wt loss: no  EtOH use: no  NSAIDs/ASA: no  LMP: Patient's last menstrual period was 10/04/2019 (exact date). Vaginal bleeding: yes, currently menstruating  STD risk/hx: no       PERTINENT  PMH / PSH: reviewed and updated as appropriate   OBJECTIVE:   BP 114/60   Pulse 89   Wt 153 lb (69.4 kg)   LMP 10/04/2019 (Exact Date)   SpO2 97%   BMI 23.26 kg/m    GEN: Uncomfortable appearing female  CV: regular rate and rhythm, no murmurs appreciated  RESP: no increased work of breathing, clear to ascultation bilaterally  ABD: Bowel sounds hyperactive. soft, mild to moderate epigastric and left upper quadrant tenderness, Non-distended, no CVA tenderness as discussed, no guarding, no rebound tenderness MSK: no edema, left paraspinal tenderness SKIN: warm, dry NEURO: grossly normal, moves all  extremities appropriately PSYCH: Normal affect, appropriate speech and behavior    ASSESSMENT/PLAN:   Generalized abdominal pain Etiology unclear at this time.  Vital signs stable.  Patient is afebrile.  Differential diagnosis includes but not limited to lactose intolerance, pancreatitis, colitis, menstrual related abdominal pain,  cholecystitis, nephrolithiasis, enteritis, abdominal abscess.  Given patient's history of similar pain when eating mozzarella a couple years ago and hyperactive bowel sounds on exam we will treat conservatively.   -Obtain CMP, CBC, lipase. -Ibuprofen 600 mg every 8 hours; taken with food -Simethicone 125 mg every 6 hours prn -Consider CT abdomen pelvis if pain not improving with conservative treatment -We will call and check up in with patient in 1 to 2 days -ED precautions given.  Constipation Start MiraLAX twice daily prn, senna daily prn      Lyndee Hensen, DO PGY-2, [facility] 10/07/2019

## 2019-10-07 NOTE — Patient Instructions (Addendum)
It was great seeing you today!  Please check-out at the front desk before leaving the clinic.   Visit Remembers: - Stop by the pharmacy to pick up your prescriptions  - Take Miralax 1 cap twice a day for the next week.  Take Senna with the Miralax for consitpaiton.  -Take a Simethicone chews up to 4 times a day for gas pain.   -Continue taking Tylenol or 600 mg ibuprofen for pain relief.   Call us if: your abdominal pain increases, develop fever or are unable to pass stool or gas from below   Regarding lab work today:  Due to recent changes in healthcare laws, you may see the results of your imaging and laboratory studies on MyChart before your provider has had a chance to review them.  I understand that in some cases there may be results that are confusing or concerning to you. Not all laboratory results come back in the same time frame and you may be waiting for multiple results in order to interpret others.  Please give [place] 72 hours in order for your provider to thoroughly review all the results before contacting the office for clarification of your results. If everything is normal, you will get a letter in the mail or a message in My Chart. Please give [place] a call if you do not hear from [place] after 2 weeks.  Please bring all of your medications with you to each visit.    If you haven't already, sign up for My Chart to have easy access to your labs results, and communication with your primary care physician.  Feel free to call with any questions or concerns at any time, at [phone].   Take care,  Dr. Rushie [facility]

## 2019-10-08 ENCOUNTER — Other Ambulatory Visit: Payer: Self-pay

## 2019-10-08 ENCOUNTER — Encounter (HOSPITAL_COMMUNITY): Payer: Self-pay | Admitting: *Deleted

## 2019-10-08 ENCOUNTER — Emergency Department (HOSPITAL_COMMUNITY)
Admission: EM | Admit: 2019-10-08 | Discharge: 2019-10-08 | Disposition: A | Discharge status: Home / Self Care | Payer: Medicaid Other | Attending: Emergency Medicine | Admitting: Emergency Medicine

## 2019-10-08 DIAGNOSIS — K5900 Constipation, unspecified: Secondary | ICD-10-CM | POA: Insufficient documentation

## 2019-10-08 DIAGNOSIS — Z5321 Procedure and treatment not carried out due to patient leaving prior to being seen by health care provider: Secondary | ICD-10-CM | POA: Diagnosis not present

## 2019-10-08 DIAGNOSIS — R109 Unspecified abdominal pain: Secondary | ICD-10-CM | POA: Diagnosis present

## 2019-10-08 DIAGNOSIS — R1084 Generalized abdominal pain: Secondary | ICD-10-CM | POA: Insufficient documentation

## 2019-10-08 HISTORY — DX: Constipation, unspecified: K59.00

## 2019-10-08 HISTORY — DX: Generalized abdominal pain: R10.84

## 2019-10-08 LAB — BASIC METABOLIC PANEL
BUN/Creatinine Ratio: 10 (ref 9–23)
BUN: 6 mg/dL (ref 6–20)
CO2: 24 mmol/L (ref 20–29)
Calcium: 9.3 mg/dL (ref 8.7–10.2)
Chloride: 102 mmol/L (ref 96–106)
Creatinine, Ser: 0.63 mg/dL (ref 0.57–1.00)
GFR calc Af Amer: 133 mL/min/{1.73_m2} (ref >59)
GFR calc non Af Amer: 116 mL/min/{1.73_m2} (ref >59)
Glucose: 91 mg/dL (ref 65–99)
Potassium: 3.8 mmol/L (ref 3.5–5.2)
Sodium: 139 mmol/L (ref 134–144)

## 2019-10-08 LAB — CBC
Hematocrit: 34.7 % (ref 34.0–46.6)
Hemoglobin: 10.7 g/dL — ABNORMAL LOW (ref 11.1–15.9)
MCH: 25.4 pg — ABNORMAL LOW (ref 26.6–33.0)
MCHC: 30.8 g/dL — ABNORMAL LOW (ref 31.5–35.7)
MCV: 82 fL (ref 79–97)
Platelets: 278 10*3/uL (ref 150–450)
RBC: 4.21 x10E6/uL (ref 3.77–5.28)
RDW: 13.2 % (ref 11.7–15.4)
WBC: 7.4 10*3/uL (ref 3.4–10.8)

## 2019-10-08 LAB — LIPASE: Lipase: 522 U/L — ABNORMAL HIGH (ref 14–72)

## 2019-10-08 NOTE — ED Notes (Signed)
Lwbs. 

## 2019-10-08 NOTE — Assessment & Plan Note (Addendum)
Start MiraLAX twice daily prn, senna daily prn

## 2019-10-08 NOTE — ED Triage Notes (Signed)
Emergency Medicine Provider Triage Evaluation Note  Margaret [name] , a 34 y.o. female  was evaluated in triage.  Pt complains of abdominal pain since monday.  She went to her primary care doctor, labs showed that her lipase yesterday was 522.  She states that currently she has not having pain in her abdomen.  She reports some nausea and vomiting.  She does not drink alcohol.  She still has her gallbladder.  She has not had any fevers.  She had a history of similar about 2 years ago.  Review of Systems  Positive: Abdominal pain yesterday which is now resolved, nausea vomiting Negative: No dysuria increased frequency or urgency.  No fevers.  Physical Exam  BP 117/60 (BP Location: Left Arm)   Pulse 75   Temp 98.6 F (37 C) (Oral)   Resp 18   Wt 69.4 kg   LMP 10/04/2019 (Exact Date)   SpO2 100%   BMI 23.26 kg/m  Gen:   Awake, no distress   HEENT:  Atraumatic  Resp:  Normal effort  Cardiac:  Normal rate  Abd:   Nondistended, nontender even in epigastric in RUQ MSK:   Moves extremities without difficulty  Neuro:  Speech clear   Medical Decision Making  Medically screening exam initiated at 4:36 PM.  Appropriate orders placed.  Kimia Finan was informed that the remainder of the evaluation will be completed by another provider, this initial triage assessment does not replace that evaluation, and the importance of remaining in the ED until their evaluation is complete.  Clinical Impression   Patient's abdomen is soft, nontender nondistended and she reports her pain has improved plan to obtain lab work to see what her lipase is currently prior to obtaining imaging.    Lorin Glass, [place] 10/08/19 1643

## 2019-10-08 NOTE — ED Triage Notes (Signed)
Pt was sent by PCP for further evaluation of pancreatitis.  Pt has had 2 days of abdominal pain and had blood work yesterday that made MD concerned for pancreatitis.  Pt states that she is feeling better now, pain less than it was.

## 2019-10-08 NOTE — Assessment & Plan Note (Addendum)
Etiology unclear at this time.  Vital signs stable.  Patient is afebrile.  Differential diagnosis includes but not limited to lactose intolerance, pancreatitis, colitis, menstrual related abdominal pain,  cholecystitis, nephrolithiasis, enteritis, abdominal abscess.  Given patient's history of similar pain when eating mozzarella a couple years ago and hyperactive bowel sounds on exam we will treat conservatively.   -Obtain CMP, CBC, lipase. -Ibuprofen 600 mg every 8 hours; taken with food -Simethicone 125 mg every 6 hours prn -Consider CT abdomen pelvis if pain not improving with conservative treatment -We will call and check up in with patient in 1 to 2 days -ED precautions given.

## 2019-10-09 ENCOUNTER — Encounter: Payer: Self-pay | Admitting: Family Medicine

## 2019-10-09 ENCOUNTER — Ambulatory Visit (INDEPENDENT_AMBULATORY_CARE_PROVIDER_SITE_OTHER): Payer: Medicaid Other | Admitting: Family Medicine

## 2019-10-09 ENCOUNTER — Ambulatory Visit: Payer: Medicaid Other | Admitting: Family Medicine

## 2019-10-09 ENCOUNTER — Encounter (HOSPITAL_BASED_OUTPATIENT_CLINIC_OR_DEPARTMENT_OTHER): Payer: Self-pay | Admitting: Emergency Medicine

## 2019-10-09 ENCOUNTER — Other Ambulatory Visit: Payer: Self-pay

## 2019-10-09 ENCOUNTER — Emergency Department (HOSPITAL_BASED_OUTPATIENT_CLINIC_OR_DEPARTMENT_OTHER)
Admission: EM | Admit: 2019-10-09 | Discharge: 2019-10-09 | Disposition: A | Discharge status: Home / Self Care | Payer: Medicaid Other | Attending: Emergency Medicine | Admitting: Emergency Medicine

## 2019-10-09 ENCOUNTER — Emergency Department (HOSPITAL_BASED_OUTPATIENT_CLINIC_OR_DEPARTMENT_OTHER): Payer: Medicaid Other

## 2019-10-09 VITALS — BP 92/64 | HR 76 | Ht 68.0 in | Wt 154.8 lb

## 2019-10-09 DIAGNOSIS — Z79899 Other long term (current) drug therapy: Secondary | ICD-10-CM | POA: Insufficient documentation

## 2019-10-09 DIAGNOSIS — K859 Acute pancreatitis without necrosis or infection, unspecified: Secondary | ICD-10-CM

## 2019-10-09 DIAGNOSIS — R1084 Generalized abdominal pain: Secondary | ICD-10-CM

## 2019-10-09 DIAGNOSIS — K5909 Other constipation: Secondary | ICD-10-CM

## 2019-10-09 DIAGNOSIS — K5900 Constipation, unspecified: Secondary | ICD-10-CM | POA: Diagnosis not present

## 2019-10-09 DIAGNOSIS — M533 Sacrococcygeal disorders, not elsewhere classified: Secondary | ICD-10-CM | POA: Diagnosis not present

## 2019-10-09 DIAGNOSIS — D649 Anemia, unspecified: Secondary | ICD-10-CM | POA: Diagnosis not present

## 2019-10-09 DIAGNOSIS — R1030 Lower abdominal pain, unspecified: Secondary | ICD-10-CM | POA: Diagnosis present

## 2019-10-09 LAB — CBC
HCT: 36.4 % (ref 36.0–46.0)
Hemoglobin: 11 g/dL — ABNORMAL LOW (ref 12.0–15.0)
MCH: 25.2 pg — ABNORMAL LOW (ref 26.0–34.0)
MCHC: 30.2 g/dL (ref 30.0–36.0)
MCV: 83.5 fL (ref 80.0–100.0)
Platelets: 315 10*3/uL (ref 150–400)
RBC: 4.36 MIL/uL (ref 3.87–5.11)
RDW: 13.9 % (ref 11.5–15.5)
WBC: 5 10*3/uL (ref 4.0–10.5)
nRBC: 0 % (ref 0.0–0.2)

## 2019-10-09 LAB — COMPREHENSIVE METABOLIC PANEL
ALT: 17 U/L (ref 0–44)
AST: 16 U/L (ref 15–41)
Albumin: 4.3 g/dL (ref 3.5–5.0)
Alkaline Phosphatase: 46 U/L (ref 38–126)
Anion gap: 9 (ref 5–15)
BUN: 7 mg/dL (ref 6–20)
CO2: 26 mmol/L (ref 22–32)
Calcium: 9.2 mg/dL (ref 8.9–10.3)
Chloride: 104 mmol/L (ref 98–111)
Creatinine, Ser: 0.55 mg/dL (ref 0.44–1.00)
GFR calc Af Amer: >60 mL/min (ref >60)
GFR calc non Af Amer: >60 mL/min (ref >60)
Glucose, Bld: 103 mg/dL — ABNORMAL HIGH (ref 70–99)
Potassium: 3.4 mmol/L — ABNORMAL LOW (ref 3.5–5.1)
Sodium: 139 mmol/L (ref 135–145)
Total Bilirubin: 0.4 mg/dL (ref 0.3–1.2)
Total Protein: 8 g/dL (ref 6.5–8.1)

## 2019-10-09 LAB — URINALYSIS, ROUTINE W REFLEX MICROSCOPIC
Bilirubin Urine: NEGATIVE
Glucose, UA: NEGATIVE mg/dL
Hgb urine dipstick: NEGATIVE
Ketones, ur: NEGATIVE mg/dL
Leukocytes,Ua: NEGATIVE
Nitrite: NEGATIVE
Protein, ur: NEGATIVE mg/dL
Specific Gravity, Urine: <1.005 — ABNORMAL LOW (ref 1.005–1.030)
pH: 7 (ref 5.0–8.0)

## 2019-10-09 LAB — PREGNANCY, URINE: Preg Test, Ur: NEGATIVE

## 2019-10-09 LAB — LIPASE, BLOOD: Lipase: 44 U/L (ref 11–51)

## 2019-10-09 MED ORDER — MORPHINE SULFATE (PF) 4 MG/ML IV SOLN
4.0000 mg | Freq: Once | INTRAVENOUS | Status: DC
  Filled 2019-10-09: qty 1

## 2019-10-09 MED ORDER — ONDANSETRON HCL 4 MG/2ML IJ SOLN
4.0000 mg | Freq: Once | INTRAMUSCULAR | Status: AC
  Administered 2019-10-09: 4 mg via INTRAVENOUS
  Filled 2019-10-09: qty 2

## 2019-10-09 MED ORDER — SODIUM CHLORIDE 0.9 % IV BOLUS
1000.0000 mL | Freq: Once | INTRAVENOUS | Status: AC
  Administered 2019-10-09: 1000 mL via INTRAVENOUS

## 2019-10-09 MED ORDER — IOHEXOL 300 MG/ML  SOLN
100.0000 mL | Freq: Once | INTRAMUSCULAR | Status: AC | PRN
  Administered 2019-10-09: 100 mL via INTRAVENOUS

## 2019-10-09 NOTE — ED Notes (Signed)
AVS reviewed with pt, copy of AVS provided as well, encouraged pt to make follow up MD appt with her Primary Care MD due to recommendations by todays ED provider. Opportunity for questions provided, again pt has refused any pain med due to her having to drive home. ED PA is aware

## 2019-10-09 NOTE — Patient Instructions (Signed)
It was wonderful to see you today.  We are screening for diabetes and checking your cholesterol today to make sure this is not contributing to your pancreas.  It is very important that you stay hydrated with plenty of water.  You can use Tylenol 650 mg as needed if any abdominal discomforts.  For your belly, you can continue to use MiraLAX or suppository to help to have regular soft bowel movements.  If you are not finding any benefit from 1 capful of MiraLAX daily you can increase this to 2 capfuls 1 in the morning and 1 at night.   We will plan on checking your blood level with iron studies on follow-up in the next few weeks.  If you have any nausea, vomiting, or more severe pain please let me know and be evaluated.

## 2019-10-09 NOTE — ED Notes (Signed)
Unable to give Morphine.  Patient stated that if she can just take Ibuprofen.

## 2019-10-09 NOTE — Discharge Instructions (Addendum)
Follow up with your doctor for recheck and to work up why you developed pancreatitis. At this time, your labs are improving and pain improving, you may go home to follow up with your doctor. Return to the ER for worsening pain, fever, vomiting or other concerning symptoms.

## 2019-10-09 NOTE — Progress Notes (Signed)
    SUBJECTIVE:   CHIEF COMPLAINT / HPI: "Discuss CT results"  Margaret [name] is a 34 year old female presenting for follow-up of abdominal pain.  She was recently seen in our clinic on 8/25 for increased gassiness/constipation, started on simethicone and obtain labs.  Lipase from the day returned at 522, sent to ED.  Seen in the ED today, CT abdomen/pelvis showing mild fat stranding around the pancreatic tail suggestive of acute pancreatitis without fluid collection.  No visualization of the appendix. Recommended to f/u with PCP to determine cause.   This afternoon she states she is doing okay. No nausea or vomiting. Some epigastric discomfort, gassiness, and bloating in her lower quadrants (which seems to bother her more than the epigastric region). No alcohol use. No previous history of pancreatitis. Liver function wnl today and no overt gallstone abnormalities on CT. Lipase now wnl. Eating and drinking as normal.   She also reports tailbone discomfort when cycling and concern for hemorrhoid as she recently felt a bump when attempting suppository. No rectal pain or bleeding.   PERTINENT  PMH / PSH: Seizure disorder, anxiety/depression, previous appendectomy   OBJECTIVE:   BP 92/64   Pulse 76   Ht 5\' 8"  (1.727 m)   Wt 154 lb 12.8 oz (70.2 kg)   LMP 10/04/2019 (Exact Date)   SpO2 99%   BMI 23.54 kg/m   General: Alert, NAD HEENT: NCAT, MMM Lungs: No increased WOB  Abdomen: soft, mild tenderness around RLQ/LLQ and epigastrium without any rebounding or guarding, non-distended, normoactive BS Msk: normal gait  Ext: Warm, dry, 2+ radial pulses   Rectal exam: no tenderness noted, external visualization only without evidence of hemorrhoid or fissure, redundant rectal tissue at 6 o'clock position.  ASSESSMENT/PLAN:   Acute pancreatitis Resolving, evident on CT and lipase now wnl today. Suspected idiopathic and atypical presentation. Could consider already migrated gallstone pancreatitis, but  less likely without gallstones evident on imaging (however U/S would be more sensitive) or cholestatic pattern to labs. Will obtain A1c and lipid panel today, but likely to be wnl. Encouraged maintaining hydration and tylenol PRN discomfort since very minimal now.    Constipation Improving and likely contributing to abdominal discomforts as well. Miralax once to twice daily for soft stool daily.   Coccyx pain Intermittent while cycling, no preceding trauma. Provided reassurance and recommended appropriate cycle posture/triailing different bike seats.   Normocytic anemia Hemoglobin 11.0 today, previously wnl. Unclear etiology and may be in setting of inflammation as above. No known blood loss. Due to concurrent inflammation, will recheck labs in one month with iron panel.     Follow up if the above not improving or sooner if any worsening of abdominal pain, N/V, or associated fever. Otherwise f/u in 1 month.   Margaret [name], Margaret [name]

## 2019-10-09 NOTE — ED Triage Notes (Addendum)
Abdominal pain since Monday.  Pt was sent here by urgent care for CT scan.  No N/V/D.   Pt states last BM 3 days ago, pain in lower quads.  Pt states her lipase level was high on Wednesday.  Pt went to [facility] on Wednesday but left without being seen.

## 2019-10-09 NOTE — ED Provider Notes (Signed)
[facility] EMERGENCY DEPARTMENT Provider Note   CSN: [id number] Arrival date & time: 10/09/19  0844     History Chief Complaint  Patient presents with  . Abdominal Pain    Margaret [name] is a 34 y.o. female.  34 year old female presents with complaint of abdominal pain for the past week.  Patient states pain currently across the lower abdomen however states for the past week it has been varying in location.  Pain resolves with bowel movements, last bowel movement was 2 days ago.  Denies nausea, vomiting, fevers, chills.  Prior abdominal surgeries include appendectomy.   Patient went to her PCP who said her bowels were hyperactive, gave her medication for constipation and drew lab work.  Patient was found to have an elevated lipase, hepatic function was not checked at that visit.  Patient followed up with urgent care and was sent to the ER due to her previously elevated lipase.        Past Medical History:  Diagnosis Date  . Appendicitis     Patient Active Problem List   Diagnosis Date Noted  . Generalized abdominal pain 10/08/2019  . Constipation 10/08/2019  . Anxiety and depression 04/24/2019  . Seizure disorder ([place]) 07/30/2017    Past Surgical History:  Procedure Laterality Date  . APPENDECTOMY  2001     OB History   No obstetric history on file.     No family history on file.  Social History   Tobacco Use  . Smoking status: Never Smoker  . Smokeless tobacco: Never Used  Vaping Use  . Vaping Use: Never used  Substance Use Topics  . Alcohol use: Never  . Drug use: Never    Home Medications Prior to Admission medications   Medication Sig Start Date End Date Taking? Authorizing Provider  ibuprofen (ADVIL) 600 MG tablet Take 1 tablet (600 mg total) by mouth every 8 (eight) hours as needed. 10/07/19   Lyndee Hensen, DO  lamoTRIgine (LAMICTAL) 150 MG tablet Take 1 tablet (150 mg total) by mouth 2 (two) times daily. 09/30/19   Cameron Sprang, MD    polyethylene glycol powder [facility]) 17 GM/SCOOP powder Take 17 g by mouth 2 (two) times daily as needed. 10/07/19   Lyndee Hensen, DO  senna (SENOKOT) 8.6 MG TABS tablet Take 1 tablet (8.6 mg total) by mouth daily as needed for mild constipation. 10/07/19   Lyndee Hensen, DO  simethicone (MYLICON) 292 MG chewable tablet Chew 1 tablet (125 mg total) by mouth every 6 (six) hours as needed for flatulence. 10/07/19   Lyndee Hensen, DO    Allergies    Patient has no known allergies.  Review of Systems   Review of Systems  Constitutional: Negative for chills and fever.  Respiratory: Negative for shortness of breath.   Cardiovascular: Negative for chest pain.  Gastrointestinal: Positive for abdominal pain and constipation. Negative for blood in stool, diarrhea, nausea and vomiting.  Genitourinary: Negative for dysuria and frequency.  Musculoskeletal: Negative for arthralgias and myalgias.  Skin: Negative for rash and wound.  Allergic/Immunologic: Negative for immunocompromised state.  Neurological: Negative for weakness.  All other systems reviewed and are negative.   Physical Exam Updated Vital Signs BP 107/61 (BP Location: Right Arm)   Pulse 82   Temp 97.6 F (36.4 C) (Oral)   Resp 16   Ht '5\' 8"'  (1.727 m)   Wt 69.4 kg   LMP 10/04/2019 (Exact Date)   SpO2 100%   BMI 23.26 kg/m  Physical Exam Vitals and nursing note reviewed.  Constitutional:      General: She is not in acute distress.    Appearance: She is well-developed. She is not diaphoretic.  HENT:     Head: Normocephalic and atraumatic.  Cardiovascular:     Rate and Rhythm: Normal rate and regular rhythm.     Heart sounds: Normal heart sounds.  Pulmonary:     Effort: Pulmonary effort is normal.     Breath sounds: Normal breath sounds.  Abdominal:     Palpations: Abdomen is soft.     Tenderness: There is abdominal tenderness in the left lower quadrant. There is no right CVA tenderness or left CVA  tenderness.  Skin:    General: Skin is warm.     Findings: No rash.  Neurological:     Mental Status: She is alert and oriented to person, place, and time.  Psychiatric:        Behavior: Behavior normal.     ED Results / Procedures / Treatments   Labs (all labs ordered are listed, but only abnormal results are displayed) Labs Reviewed  COMPREHENSIVE METABOLIC PANEL - Abnormal; Notable for the following components:      Result Value   Potassium 3.4 (*)    Glucose, Bld 103 (*)    All other components within normal limits  CBC - Abnormal; Notable for the following components:   Hemoglobin 11.0 (*)    MCH 25.2 (*)    All other components within normal limits  URINALYSIS, ROUTINE W REFLEX MICROSCOPIC - Abnormal; Notable for the following components:   Color, Urine STRAW (*)    Specific Gravity, Urine <1.005 (*)    All other components within normal limits  LIPASE, BLOOD  PREGNANCY, URINE    EKG None  Radiology CT Abdomen Pelvis W Contrast  Result Date: 10/09/2019 CLINICAL DATA:  Lower abdominal pain since Monday EXAM: CT ABDOMEN AND PELVIS WITH CONTRAST TECHNIQUE: Multidetector CT imaging of the abdomen and pelvis was performed using the standard protocol following bolus administration of intravenous contrast. CONTRAST:  169m OMNIPAQUE IOHEXOL 300 MG/ML  SOLN COMPARISON:  None. FINDINGS: Lower chest: No acute abnormality. Hepatobiliary: No solid liver abnormality is seen. No gallstones, gallbladder wall thickening, or biliary dilatation. Pancreas: There is mild fat stranding about the pancreatic tail (series 2, image 25). No pancreatic ductal dilatation or fluid collection. Spleen: Normal in size without significant abnormality. Adrenals/Urinary Tract: Adrenal glands are unremarkable. Kidneys are normal, without renal calculi, solid lesion, or hydronephrosis. Bladder is unremarkable. Stomach/Bowel: Stomach is within normal limits. Appendix is not clearly visualized and may be  diminutive or surgically absent. No evidence of bowel wall thickening, distention, or inflammatory changes. Vascular/Lymphatic: No significant vascular findings are present. No enlarged abdominal or pelvic lymph nodes. Reproductive: No mass or other significant abnormality. Benign functional ovarian cysts and follicles. Other: No abdominal wall hernia or abnormality. No abdominopelvic ascites. Musculoskeletal: No acute or significant osseous findings. Fluid attenuation subcutaneous lesion of the right of midline on the low back, consistent with a definitively benign cutaneous inclusion cyst (series 2, image 36). IMPRESSION: 1. There is mild fat stranding about the pancreatic tail, suggestive of acute pancreatitis. No pancreatic ductal dilatation or fluid collection. 2. The appendix is not clearly visualized and may be diminutive or surgically absent. There are no inflammatory findings in the right lower quadrant to suggest acute appendicitis. Electronically Signed   By: AEddie CandleM.D.   On: 10/09/2019 12:52    Procedures  Procedures (including critical care time)  Medications Ordered in ED Medications  morphine 4 MG/ML injection 4 mg (4 mg Intravenous Not Given 10/09/19 1309)  sodium chloride 0.9 % bolus 1,000 mL (0 mLs Intravenous Stopped 10/09/19 1308)  ondansetron (ZOFRAN) injection 4 mg (4 mg Intravenous Given 10/09/19 1152)  iohexol (OMNIPAQUE) 300 MG/ML solution 100 mL (100 mLs Intravenous Contrast Given 10/09/19 1225)    ED Course  I have reviewed the triage vital signs and the nursing notes.  Pertinent labs & imaging results that were available during my care of the patient were reviewed by me and considered in my medical decision making (see chart for details).  Clinical Course as of Oct 08 1345  Fri Oct 08, 1721  4636 34 year old female with complaint of abdominal pain for the past week.  Patient was seen at PCP and urgent care, found to have elevated lipase and sent to the ER for  further evaluation.  Upon arrival in the emergency room, patient reports left lower abdominal pain, improves with bowel movements, states has not had a bowel movement in 2 days.  On exam has very mild left lower quadrant tenderness.  Patient is otherwise well-appearing, no fevers, no vomiting. Labs today are improved compared to prior, lipase is currently 44, CMP with normal LFTs, alk phos, bili.  CBC with normal white count, pregnancy test is negative, urinalysis normal. CT shows acute pancreatitis.  Suspect this is improving as her lipase has improved and her pain is improved overall. Discussed with Dr. Billy Fischer, ER attending.  Discussed results with patient, as pain is improving and patient is not febrile or vomiting, patient is comfortable with discharge home at this time, she will follow-up with her PCP to work-up identifiable causes of pancreatitis, given strict return to ER precautions including fever, worsening pain, vomiting.   [LM]    Clinical Course User Index [LM] Roque Lias   MDM Rules/Calculators/A&P                          Final Clinical Impression(s) / ED Diagnoses Final diagnoses:  Acute pancreatitis without infection or necrosis, unspecified pancreatitis type    Rx / DC Orders ED Discharge Orders    None       Tacy Learn, PA-C 10/09/19 1347    Gareth Morgan, MD 10/12/19 1031

## 2019-10-09 NOTE — ED Notes (Signed)
ED Provider at bedside. 

## 2019-10-10 LAB — LIPID PANEL
Chol/HDL Ratio: 3 ratio (ref 0.0–4.4)
Cholesterol, Total: 161 mg/dL (ref 100–199)
HDL: 53 mg/dL (ref >39)
LDL Chol Calc (NIH): 95 mg/dL (ref 0–99)
Triglycerides: 67 mg/dL (ref 0–149)
VLDL Cholesterol Cal: 13 mg/dL (ref 5–40)

## 2019-10-10 LAB — HEMOGLOBIN A1C
Est. average glucose Bld gHb Est-mCnc: 108 mg/dL
Hgb A1c MFr Bld: 5.4 % (ref 4.8–5.6)

## 2019-10-11 ENCOUNTER — Encounter: Payer: Self-pay | Admitting: Family Medicine

## 2019-10-11 DIAGNOSIS — M549 Dorsalgia, unspecified: Secondary | ICD-10-CM | POA: Insufficient documentation

## 2019-10-11 DIAGNOSIS — M533 Sacrococcygeal disorders, not elsewhere classified: Secondary | ICD-10-CM | POA: Insufficient documentation

## 2019-10-11 DIAGNOSIS — D649 Anemia, unspecified: Secondary | ICD-10-CM

## 2019-10-11 DIAGNOSIS — K859 Acute pancreatitis without necrosis or infection, unspecified: Secondary | ICD-10-CM | POA: Insufficient documentation

## 2019-10-11 HISTORY — DX: Dorsalgia, unspecified: M54.9

## 2019-10-11 HISTORY — DX: Anemia, unspecified: D64.9

## 2019-10-11 HISTORY — DX: Sacrococcygeal disorders, not elsewhere classified: M53.3

## 2019-10-11 NOTE — Assessment & Plan Note (Signed)
Hemoglobin 11.0 today, previously wnl. Unclear etiology and may be in setting of inflammation as above. No known blood loss. Due to concurrent inflammation, will recheck labs in one month with iron panel.

## 2019-10-11 NOTE — Assessment & Plan Note (Signed)
Improving and likely contributing to abdominal discomforts as well. Miralax once to twice daily for soft stool daily.
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
## 2019-10-11 NOTE — Assessment & Plan Note (Signed)
Resolving, evident on CT and lipase now wnl today. Suspected idiopathic and atypical presentation. Could consider already migrated gallstone pancreatitis, but less likely without gallstones evident on imaging (however U/S would be more sensitive) or cholestatic pattern to labs. Will obtain A1c and lipid panel today, but likely to be wnl. Encouraged maintaining hydration and tylenol PRN discomfort since very minimal now.

## 2019-10-15 ENCOUNTER — Telehealth: Payer: Self-pay

## 2019-10-15 NOTE — Telephone Encounter (Signed)
Patient calls requesting a referral to [facility] to see Dr. Earlean Shawl for a colonoscopy. Please advise.

## 2019-10-20 ENCOUNTER — Other Ambulatory Visit: Payer: Self-pay | Admitting: Family Medicine

## 2019-10-20 DIAGNOSIS — K859 Acute pancreatitis without necrosis or infection, unspecified: Secondary | ICD-10-CM

## 2019-10-20 NOTE — Telephone Encounter (Signed)
Called patient discuss indication for colonoscopy. Patient states she was just requesting a GI referral for her episode of acute pancreatitis and not necessarily a colonoscopy. Her abdominal pain is now resolved.   Discussed that I would gladly place a referral for an additional information per her request, but it would be unlikely that they would do any additional workup. She has no questions for me currently. She understands and would to proceed with evaluation.   [name], DO

## 2020-01-20 ENCOUNTER — Ambulatory Visit (INDEPENDENT_AMBULATORY_CARE_PROVIDER_SITE_OTHER): Payer: Medicaid Other | Admitting: Family Medicine

## 2020-01-20 ENCOUNTER — Other Ambulatory Visit: Payer: Self-pay

## 2020-01-20 ENCOUNTER — Encounter: Payer: Self-pay | Admitting: Family Medicine

## 2020-01-20 VITALS — BP 90/65 | Ht 68.0 in | Wt 150.8 lb

## 2020-01-20 DIAGNOSIS — R109 Unspecified abdominal pain: Secondary | ICD-10-CM | POA: Diagnosis not present

## 2020-01-20 DIAGNOSIS — F32A Depression, unspecified: Secondary | ICD-10-CM | POA: Diagnosis not present

## 2020-01-20 DIAGNOSIS — Z1159 Encounter for screening for other viral diseases: Secondary | ICD-10-CM

## 2020-01-20 DIAGNOSIS — F419 Anxiety disorder, unspecified: Secondary | ICD-10-CM | POA: Diagnosis not present

## 2020-01-20 DIAGNOSIS — D649 Anemia, unspecified: Secondary | ICD-10-CM | POA: Diagnosis not present

## 2020-01-20 DIAGNOSIS — E559 Vitamin D deficiency, unspecified: Secondary | ICD-10-CM

## 2020-01-20 DIAGNOSIS — Z8719 Personal history of other diseases of the digestive system: Secondary | ICD-10-CM | POA: Diagnosis not present

## 2020-01-20 DIAGNOSIS — Z0000 Encounter for general adult medical examination without abnormal findings: Secondary | ICD-10-CM

## 2020-01-20 DIAGNOSIS — Z114 Encounter for screening for human immunodeficiency virus [HIV]: Secondary | ICD-10-CM | POA: Diagnosis not present

## 2020-01-20 DIAGNOSIS — E038 Other specified hypothyroidism: Secondary | ICD-10-CM | POA: Diagnosis not present

## 2020-01-20 DIAGNOSIS — R7989 Other specified abnormal findings of blood chemistry: Secondary | ICD-10-CM

## 2020-01-20 DIAGNOSIS — Z411 Encounter for cosmetic surgery: Secondary | ICD-10-CM | POA: Insufficient documentation

## 2020-01-20 NOTE — Patient Instructions (Signed)
Therapy and Counseling Resources Most providers on this list will take Medicaid. Patients with commercial insurance or Medicare should contact their insurance company to get a list of in network providers.  BestDay:Psychiatry and Counseling [address] [facility] [place]. [place], [place] [address] [place]  [address], [place], [place] [address]      [place] [address]  [place], [place] [address] [phone]  [place] [address] [place]  [place], [place] [address]       [phone]      Jinny Blossom [facility] [address] [place], [place]  Family Solutions:  [place]. [place] [phone]  [facility]:  [place] [address] [place] [phone]  [facility] (under & uninsured) [address] [place] [phone]    [email or web address]    [place] [address] . [place]    [phone]  [facility] [place]     Phone:  [phone]     [place]  [place] [address]      [place], [place] ext [place]: [place], [place], [place]   [place] ([place] therapist) [email or web address]  [place] [address]   [place] [address]    [phone]    [facility]   [address],  [place], [place]   [place]  [place]  [facility]  [address] [place], [place]        [phone]  Open Access/Walk In Clinic under & uninsured  [facility]  [address] [place], [place] Crisis [phone]  Family  Service of the [place],  ([place])   [place], [place]: [phone]) 8:30 - 12; 1 - 2:30  Family Service of the [place],  [place], [place]    [phone]):8:30 - 12; 2 - 3PM  [facility] [employer],  [address]  [place]; [phone]):   Mon - Fri 8 AM - 5 PM  [facility] [place]  MWF 12:30 to 3:00 or call to schedule an appointment  [phone]  Specific Provider options Psychology Today  [email or web address] 1. click on find a therapist  2. enter your zip code 3. left side and select or tailor a therapist for your specific need.   [facility] Provider Directory [email or web address]  (Medicaid)   Follow all drop down to find a provider  [place] [phone] or [email or web address] [address], [place], [place] Recovery support and educational   24- Hour Availability:  .  [place] [facility]  . [place], [place] [address] Crisis [phone]  . Family Service of the [employer] [phone]  [facility] Crisis Service  [phone]   . [place]  [phone] (after hours)  . Therapeutic Alternative/Mobile Crisis   [phone]  . [place] National Suicide Hotline  [phone] ([place])  . Call 911 or go to emergency room  . [employer]  [phone]);  [place] and  [place]   . [place] ACCESS  [phone]); [place], [place], [place], [place], [place], [place], [place]

## 2020-01-20 NOTE — Assessment & Plan Note (Signed)
Hgb 11 in 09/2019.  Recheck CBC with ferritin today.

## 2020-01-20 NOTE — Progress Notes (Signed)
    SUBJECTIVE:    CHIEF COMPLAINT / HPI: Check iron/vitamin D  Margaret [name] is a 34 year old female presenting discussed the following:  Normocytic anemia: Incidentally noted during ED visit in 09/2019 with a hemoglobin of 11.  Prior to this that had a normal CBC.  As this was in the same time of acute inflammation of her pancreas, recommended rechecking her labs later date.  She would like to check her iron levels and CBC today.  Vitamin D deficiency: Reports a history of this several years ago and previously took supplementation, has not had level checked in quite some time.  History of pancreatitis: Seen in the ED in 09/2019 with abdominal pain, CT showed pancreatic inflammation consistent with acute pancreatitis.  Lipase slightly elevated at 44 at that time.  She would like to repeat this today to make sure that it is normal as this is been a sense of stress for her.  She is not having any further abdominal pain, nausea, vomiting, change in bowel movements.  Anxiety/depression: She feels becomes easily irritated and feels anxious often.  She would like referral to see a therapist.  Not interested in any medications.  Denies any SI/HI.  GAD 7 : Generalized Anxiety Score 01/20/2020 04/24/2019  Nervous, Anxious, on Edge 1 1  Control/stop worrying 1 1  Worry too much - different things 1 1  Trouble relaxing 0 1  Restless 0 0  Easily annoyed or irritable 2 2  Afraid - awful might happen 0 1  Total GAD 7 Score 5 7     Office Visit from 01/20/2020 in [facility] Office Visit from 10/09/2019 in [facility] Office Visit from 10/07/2019 in [facility]  Thoughts that you would be better off dead, or of hurting yourself in some way Not at all Not at all Not at all  PHQ-9 Total Score 7 5 5       PERTINENT  PMH / PSH: Seizure disorder, anxiety/depression, previous appendectomy   OBJECTIVE:   BP 90/65   Ht 5\' 8"  (1.727 m)   Wt 150 lb 12.8  oz (68.4 kg)   BMI 22.93 kg/m   General: Alert, NAD HEENT: NCAT, MMM Cardiac: RRR no m/g/r Lungs: Clear bilaterally, no increased WOB  Abdomen: soft, non-tender, non-distended, normoactive BS Msk: Moves all extremities spontaneously  Ext: Warm, dry, 2+ distal pulses  ASSESSMENT/PLAN:   Normocytic anemia Hgb 11 in 09/2019.  Recheck CBC with ferritin today.  Vitamin D deficiency Not currently on supplementation.  Recheck vitamin D.  Healthcare maintenance Obtain hepatitis C and HIV screening, asymptomatic and low risk.  History of pancreatitis Mild episode most recently in 09/2019 with minimal elevation in lipase.  No further abdominal pain with benign abdominal exam.  Per patient request, will recheck lipase today to provide additional comfort I suspect that should be normal.  Anxiety and depression PHQ score of 7 without SI/HI, GAD score 5.  Discussed breathing techniques and working towards self-awareness when she feels herself becoming particularly irritable.  Encouraged to incorporate stress relieving activities into her daily life.  Provided therapy resources to establish care.  Subclinical hypothyroidism Present in 04/2019, will go ahead and repeat TSH/T4 today with labs.    Follow-up in 1 month or sooner if needed/pending lab results.  [name], [place]

## 2020-01-20 NOTE — Assessment & Plan Note (Signed)
Mild episode most recently in 09/2019 with minimal elevation in lipase.  No further abdominal pain with benign abdominal exam.  Per patient request, will recheck lipase today to provide additional comfort I suspect that should be normal.

## 2020-01-20 NOTE — Assessment & Plan Note (Signed)
Obtain hepatitis C and HIV screening, asymptomatic and low risk.

## 2020-01-20 NOTE — Assessment & Plan Note (Signed)
PHQ score of 7 without SI/HI, GAD score 5.  Discussed breathing techniques and working towards self-awareness when she feels herself becoming particularly irritable.  Encouraged to incorporate stress relieving activities into her daily life.  Provided therapy resources to establish care.

## 2020-01-20 NOTE — Assessment & Plan Note (Signed)
Present in 04/2019, will go ahead and repeat TSH/T4 today with labs.

## 2020-01-20 NOTE — Assessment & Plan Note (Signed)
Not currently on supplementation.  Recheck vitamin D.

## 2020-01-21 LAB — FERRITIN: Ferritin: 5 ng/mL — ABNORMAL LOW (ref 15–150)

## 2020-01-21 LAB — CBC
Hematocrit: 32 % — ABNORMAL LOW (ref 34.0–46.6)
Hemoglobin: 10.1 g/dL — ABNORMAL LOW (ref 11.1–15.9)
MCH: 24 pg — ABNORMAL LOW (ref 26.6–33.0)
MCHC: 31.6 g/dL (ref 31.5–35.7)
MCV: 76 fL — ABNORMAL LOW (ref 79–97)
Platelets: 287 10*3/uL (ref 150–450)
RBC: 4.21 x10E6/uL (ref 3.77–5.28)
RDW: 15.4 % (ref 11.7–15.4)
WBC: 7.1 10*3/uL (ref 3.4–10.8)

## 2020-01-21 LAB — VITAMIN D 25 HYDROXY (VIT D DEFICIENCY, FRACTURES): Vit D, 25-Hydroxy: 22.1 ng/mL — ABNORMAL LOW (ref 30.0–100.0)

## 2020-01-21 LAB — LIPASE: Lipase: 41 U/L (ref 14–72)

## 2020-01-21 LAB — HIV ANTIBODY (ROUTINE TESTING W REFLEX): HIV Screen 4th Generation wRfx: NONREACTIVE

## 2020-01-21 LAB — TSH+FREE T4
Free T4: 1.12 ng/dL (ref 0.82–1.77)
TSH: 4.95 u[IU]/mL — ABNORMAL HIGH (ref 0.450–4.500)

## 2020-01-21 LAB — HEPATITIS C ANTIBODY: Hep C Virus Ab: <0.1 s/co ratio (ref 0.0–0.9)

## 2020-01-22 ENCOUNTER — Other Ambulatory Visit: Payer: Self-pay | Admitting: Family Medicine

## 2020-01-22 DIAGNOSIS — E559 Vitamin D deficiency, unspecified: Secondary | ICD-10-CM

## 2020-01-22 DIAGNOSIS — D509 Iron deficiency anemia, unspecified: Secondary | ICD-10-CM

## 2020-01-22 MED ORDER — FERROUS SULFATE 325 (65 FE) MG PO TBEC: 325.0000 mg | DELAYED_RELEASE_TABLET | Freq: Every day | ORAL | 3 refills | Status: DC

## 2020-01-22 MED ORDER — CHOLECALCIFEROL 50 MCG (2000 UT) PO CAPS: 1.0000 | ORAL_CAPSULE | Freq: Every day | ORAL | 2 refills | Status: DC

## 2020-02-24 ENCOUNTER — Telehealth: Payer: Self-pay

## 2020-02-24 NOTE — Telephone Encounter (Signed)
Patient calls nurse line regarding side effects related to vitamin D and ferrous sulfate. Patient reports GI upset including, abdominal pain, bloating, constipation. Patient states that she has been taking medications with meals, however, is still having complaints.  Patient reports that in the past she took vitamin D once weekly and that worked better for her.   Patient reports that she took medication as prescribed for the first two weeks but then discontinued due to side effects and has not been taking medications for last two weeks, stomach complaints have resolved since d/cing the medications.   Reports that after stopping medications due to GI complaints she is beginning to feel fatigue again.   Patient is requesting further instruction from provider on how to proceed.   Talbot Grumbling, RN

## 2020-02-26 NOTE — Telephone Encounter (Signed)
Call patient in reference to abdominal pain/bloating/constipation with ferrous sulfate and vitamin D supplementation.  Discussed trial of every other day or every 2 days with both, can also restart in a stepwise fashion to see if one versus the other is more bothersome (mainly suspect the iron).   Additionally recommended trying the lucky iron fish if she would like to purchase this online to help with her iron levels.  Recommended following up in approximately 2 months with recheck of CBC/vitamin D at that time.  Call back sooner if she is still having difficulty tolerating both medications, could consider IV infusion.  [name], DO

## 2020-06-21 ENCOUNTER — Encounter (HOSPITAL_COMMUNITY): Payer: Self-pay

## 2020-06-21 ENCOUNTER — Emergency Department (HOSPITAL_COMMUNITY)
Admission: EM | Admit: 2020-06-21 | Discharge: 2020-06-21 | Disposition: A | Discharge status: Home / Self Care | Payer: Medicaid Other | Attending: Emergency Medicine | Admitting: Emergency Medicine

## 2020-06-21 ENCOUNTER — Other Ambulatory Visit: Payer: Self-pay

## 2020-06-21 DIAGNOSIS — F69 Unspecified disorder of adult personality and behavior: Secondary | ICD-10-CM | POA: Insufficient documentation

## 2020-06-21 DIAGNOSIS — Z79899 Other long term (current) drug therapy: Secondary | ICD-10-CM | POA: Diagnosis not present

## 2020-06-21 DIAGNOSIS — R4689 Other symptoms and signs involving appearance and behavior: Secondary | ICD-10-CM

## 2020-06-21 DIAGNOSIS — Z8669 Personal history of other diseases of the nervous system and sense organs: Secondary | ICD-10-CM | POA: Insufficient documentation

## 2020-06-21 DIAGNOSIS — F061 Catatonic disorder due to known physiological condition: Secondary | ICD-10-CM | POA: Diagnosis not present

## 2020-06-21 LAB — COMPREHENSIVE METABOLIC PANEL
ALT: 15 U/L (ref 0–44)
AST: 23 U/L (ref 15–41)
Albumin: 3.8 g/dL (ref 3.5–5.0)
Alkaline Phosphatase: 41 U/L (ref 38–126)
Anion gap: 8 (ref 5–15)
BUN: 7 mg/dL (ref 6–20)
CO2: 24 mmol/L (ref 22–32)
Calcium: 8.6 mg/dL — ABNORMAL LOW (ref 8.9–10.3)
Chloride: 107 mmol/L (ref 98–111)
Creatinine, Ser: 0.66 mg/dL (ref 0.44–1.00)
GFR, Estimated: >60 mL/min (ref >60)
Glucose, Bld: 89 mg/dL (ref 70–99)
Potassium: 4.1 mmol/L (ref 3.5–5.1)
Sodium: 139 mmol/L (ref 135–145)
Total Bilirubin: 0.5 mg/dL (ref 0.3–1.2)
Total Protein: 6.8 g/dL (ref 6.5–8.1)

## 2020-06-21 LAB — CBC WITH DIFFERENTIAL/PLATELET
Abs Immature Granulocytes: 0.01 10*3/uL (ref 0.00–0.07)
Basophils Absolute: 0 10*3/uL (ref 0.0–0.1)
Basophils Relative: 1 %
Eosinophils Absolute: 0.1 10*3/uL (ref 0.0–0.5)
Eosinophils Relative: 2 %
HCT: 31.4 % — ABNORMAL LOW (ref 36.0–46.0)
Hemoglobin: 9.5 g/dL — ABNORMAL LOW (ref 12.0–15.0)
Immature Granulocytes: 0 %
Lymphocytes Relative: 33 %
Lymphs Abs: 1.6 10*3/uL (ref 0.7–4.0)
MCH: 23.5 pg — ABNORMAL LOW (ref 26.0–34.0)
MCHC: 30.3 g/dL (ref 30.0–36.0)
MCV: 77.7 fL — ABNORMAL LOW (ref 80.0–100.0)
Monocytes Absolute: 0.4 10*3/uL (ref 0.1–1.0)
Monocytes Relative: 8 %
Neutro Abs: 2.7 10*3/uL (ref 1.7–7.7)
Neutrophils Relative %: 56 %
Platelets: 283 10*3/uL (ref 150–400)
RBC: 4.04 MIL/uL (ref 3.87–5.11)
RDW: 17.7 % — ABNORMAL HIGH (ref 11.5–15.5)
WBC: 4.8 10*3/uL (ref 4.0–10.5)
nRBC: 0 % (ref 0.0–0.2)

## 2020-06-21 LAB — MAGNESIUM: Magnesium: 2.1 mg/dL (ref 1.7–2.4)

## 2020-06-21 MED ORDER — ACETAMINOPHEN 325 MG PO TABS
650.0000 mg | ORAL_TABLET | Freq: Once | ORAL | Status: AC
  Administered 2020-06-21: 650 mg via ORAL
  Filled 2020-06-21: qty 2

## 2020-06-21 NOTE — ED Notes (Signed)
Pt ambulatory to [facility]
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
## 2020-06-21 NOTE — ED Provider Notes (Signed)
[facility] EMERGENCY DEPARTMENT Provider Note   CSN: [id number] Arrival date & time: 06/21/20  1628     History Chief Complaint  Patient presents with  . Altered Mental Status    Margaret [name] is a 35 y.o. female.  HPI Patient is a 35 year old female with medical history as noted below.  She has a history of seizure disorder that was diagnosed in [place].  She has been taking Lamictal for years.  Denies any missed doses.  EMS was called out today due to shortness of breath.  When they arrived patient had no obvious shortness of breath and her lungs were clear to auscultation.  Patient appeared to be in a catatonic state and was drooling and not answering questions.  Patient did not speak throughout the exam or during transport to the emergency department.  Upon arrival I evaluated the patient who is now speaking in short whispers.  She is alert and oriented x3.  Moving all 4 extremities.  Appears extremely fatigued and does not participate in any significant conversation during my exam.  There was concern by EMS that her home weight not be a safe environment.  They were told that a few days ago that she had a serious argument with other relatives about "religion".  She told EMS that she did not want her husband to be in the room when she arrived.  Upon further questioning patient now notes that 3 days ago she was in [place] with her husband's family.  They had a "religious argument".  Her husband's family yelled at her during this argument.  This was very traumatic for her.  She states that she has a history of seizure disorder and when she gets increasingly stressed will have seizures.  Due to this, she is trying to not become increasingly stressed and wanted to come to the hospital to be monitored in the event that she has another seizure.  She states she has been taking her Lamictal as prescribed. She denies any recent seizures.   Patient previously lived in [place] and  then [place] before moving to [place].  She was evaluated by neurology in [place] last year.  Their MDM is below.  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 She reports having an abnormal EEG in 2010 and most recently in 2016. Records from her prior neurologist in [facility] will be requested for review. She reports having a normal MRI brain. Her last GTC was in 2012. No side effects on Lamotrigine 150mg  BID, refills sent. We discussed issues in women with epilepsy, no current pregnancy plans. [place] driving laws were discussed with the patient, and she knows to stop driving after a seizure, until 6 months seizure-free. She is reporting anger/irritability and would like to proceed with psychotherapy, referral will be sent. Follow-up in 1 year, she knows to call for any changes.       Past Medical History:  Diagnosis Date  . Appendicitis   . Coccyx pain 10/11/2019    Patient Active Problem List   Diagnosis Date Noted  . Vitamin D deficiency 01/20/2020  . Healthcare maintenance 01/20/2020  . History of pancreatitis 01/20/2020  . 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 Acute pancreatitis 10/11/2019  . Normocytic anemia 10/11/2019  . Generalized abdominal pain 10/08/2019  . Constipation 10/08/2019  . Anxiety and depression 04/24/2019  . Seizure disorder ([place]) 07/30/2017    Past  Surgical History:  Procedure Laterality Date  . APPENDECTOMY  2001     OB History   No obstetric history on file.     History reviewed. No pertinent family history.  Social History   Tobacco Use  . Smoking status: Never Smoker  . Smokeless tobacco: Never Used  Vaping Use  . Vaping Use: Never used  Substance Use Topics  . Alcohol use: Never  . Drug use: Never    Home Medications Prior to Admission medications   Medication Sig Start Date End Date Taking? Authorizing Provider   Cholecalciferol 50 MCG (2000 UT) CAPS Take 1 capsule (2,000 Units total) by mouth daily. 01/22/20   [name], DO  ferrous sulfate 325 (65 FE) MG EC tablet Take 1 tablet (325 mg total) by mouth daily with breakfast. 01/22/20   [name], DO  ibuprofen (ADVIL) 600 MG tablet Take 1 tablet (600 mg total) by mouth every 8 (eight) hours as needed. 10/07/19   Lyndee Hensen, DO  lamoTRIgine (LAMICTAL) 150 MG tablet Take 1 tablet (150 mg total) by mouth 2 (two) times daily. 09/30/19   Cameron Sprang, MD  polyethylene glycol powder [facility]) 17 GM/SCOOP powder Take 17 g by mouth 2 (two) times daily as needed. 10/07/19   Lyndee Hensen, DO  senna (SENOKOT) 8.6 MG TABS tablet Take 1 tablet (8.6 mg total) by mouth daily as needed for mild constipation. 10/07/19   Lyndee Hensen, DO  simethicone (MYLICON) 253 MG chewable tablet Chew 1 tablet (125 mg total) by mouth every 6 (six) hours as needed for flatulence. 10/07/19   Lyndee Hensen, DO    Allergies    Patient has no known allergies.  Review of Systems   Review of Systems  All other systems reviewed and are negative. Ten systems reviewed and are negative for acute change, except as noted in the HPI.   Physical Exam Updated Vital Signs BP 105/77   Pulse 67   Temp 99.6 F (37.6 C) (Oral)   Resp 12   Ht 5\' 8"  (1.727 m)   Wt 68.4 kg   SpO2 98%   BMI 22.93 kg/m   Physical Exam Vitals and nursing note reviewed.  Constitutional:      General: She is not in acute distress.    Appearance: Normal appearance. She is not ill-appearing, toxic-appearing or diaphoretic.  HENT:     Head: Normocephalic and atraumatic.     Right Ear: External ear normal.     Left Ear: External ear normal.     Nose: Nose normal.     Mouth/Throat:     Mouth: Mucous membranes are moist.     Pharynx: Oropharynx is clear. No oropharyngeal exudate or posterior oropharyngeal erythema.     Comments: Oropharynx is clear.  Moist.  No lacerations noted  along the tongue. Eyes:     General: No scleral icterus.       Right eye: No discharge.        Left eye: No discharge.     Extraocular Movements: Extraocular movements intact.     Conjunctiva/sclera: Conjunctivae normal.     Pupils: Pupils are equal, round, and reactive to light.  Cardiovascular:     Rate and Rhythm: Normal rate and regular rhythm.     Pulses: Normal pulses.     Heart sounds: Normal heart sounds. No murmur heard. No friction rub. No gallop.   Pulmonary:     Effort: Pulmonary effort is normal. No respiratory distress.  Breath sounds: Normal breath sounds. No stridor. No wheezing, rhonchi or rales.  Abdominal:     General: Abdomen is flat.     Tenderness: There is no abdominal tenderness.  Musculoskeletal:        General: Normal range of motion.     Cervical back: Normal range of motion and neck supple. No tenderness.  Skin:    General: Skin is warm and dry.  Neurological:     General: No focal deficit present.     Mental Status: She is alert and oriented to person, place, and time.     Comments: A&O x3.  Moving all 4 extremities.  No obvious deficits.  No facial droop.  Speaking in short whispers.  Psychiatric:        Attention and Perception: She is inattentive.        Mood and Affect: Affect is tearful.        Speech: Speech is delayed.        Behavior: Behavior is withdrawn.    ED Results / Procedures / Treatments   Labs (all labs ordered are listed, but only abnormal results are displayed) Labs Reviewed  COMPREHENSIVE METABOLIC PANEL - Abnormal; Notable for the following components:      Result Value   Calcium 8.6 (*)    All other components within normal limits  CBC WITH DIFFERENTIAL/PLATELET - Abnormal; Notable for the following components:   Hemoglobin 9.5 (*)    HCT 31.4 (*)    MCV 77.7 (*)    MCH 23.5 (*)    RDW 17.7 (*)    All other components within normal limits  MAGNESIUM  URINALYSIS, ROUTINE W REFLEX MICROSCOPIC    EKG None  Radiology No results found.  Procedures Procedures   Medications Ordered in ED Medications  acetaminophen (TYLENOL) tablet 650 mg (650 mg Oral Given 06/21/20 2050)    ED Course  I have reviewed the triage vital signs and the nursing notes.  Pertinent labs & imaging results that were available during my care of the patient were reviewed by me and considered in my medical decision making (see chart for details).    MDM Rules/Calculators/A&P                          Pt is a 35 y.o. female who presents the emergency department due to what appears to be anxiety.  Labs: CBC with a hemoglobin of 9.5, MCV of 77.7, RDW of 17.7. CMP with a calcium of 8.6. Magnesium of 2.1.  I, Rayna Sexton, PA-C, personally reviewed and evaluated these images and lab results as part of my medical decision-making.  Patient initially presented nonverbal.  Neurological exam was benign.  As time progressed patient began speaking to myself as well as other staff.  She apparently had an argument with her husband's family at an event in [place] a few days ago.  They did not agree with her religious practices.  Due to this patient was under extreme distress.  She has a history of seizure disorder and because of this was concerned that her stress and anxiety could cause her to experience a seizure.  She denies any recent seizure-like activity.  Her husband denied any seizure-like activity when EMS arrived.  Patient has been monitored in the emergency department for many hours with no adverse events.  We consulted our social work team who spoke with the patient further as well as her husband.  Patient was  given further resources.  Lab work today is generally reassuring.  Feel that she is stable for discharge at this time and she is agreeable.  She states that she feels safe in her home and is comfortable being discharged.  Urged her to come back to the emergency department if she develops any new  or worsening symptoms or has any concern whatsoever regarding her safety.  Her questions were answered and she was amicable at the time of discharge.  Note: Portions of this report may have been transcribed using voice recognition software. Every effort was made to ensure accuracy; however, inadvertent computerized transcription errors may be present.    Final Clinical Impression(s) / ED Diagnoses Final diagnoses:  Behavioral change   Rx / DC Orders ED Discharge Orders    None       Rayna Sexton, PA-C 06/21/20 2228    Lucrezia Starch, MD 06/21/20 2352

## 2020-06-21 NOTE — Discharge Instructions (Signed)
Below is the contact information for family services at [place].  Please give them a call soon as possible and schedule a follow-up appointment.  If you have any new or worsening symptoms or do not feel safe in your home, please come back to the emergency department immediately or call 911.  It was a pleasure to meet you.

## 2020-07-14 NOTE — Progress Notes (Signed)
    SUBJECTIVE:   Chief compliant/HPI: annual examination  Margaret [name] is a 35 y.o. who presents today for an annual exam.  She reports she overall is doing well.  Chronic depressed mood she is very excited to start with a therapist in the next 2 weeks.  She walks 3-4 days weekly.  Reports "junk food" diet that she is trying to work towards improving.  She has a history of iron deficiency anemia, she has not been able to tolerate the ferrous sulfate due to GI upset.  She requests a refill of her vitamin D, feels like this helps with her mood.  Due for papsmear today.  Not sexually active but would like to have vaginal STD screening added to her Pap smear.  Updated history tabs and problem list: yes.   OBJECTIVE:   BP 106/62   Pulse 84   Ht 5\' 8"  (1.727 m)   Wt 149 lb (67.6 kg)   LMP 06/19/2020 (Approximate)   SpO2 98%   BMI 22.66 kg/m   General: Alert, NAD HEENT: NCAT, MMM, thyroid not enlarged without nodules Cardiac: RRR no m/g/r Lungs: Clear bilaterally, no increased WOB  Abdomen: soft Msk: Moves all extremities spontaneously, normal gait Ext: Warm, dry, 2+ distal pulses Derm: Approximate 2.5 soft tissue mass/nodule present out of her lower back central opening.  Nonfluctuant with hard material within.  Picture below.     ASSESSMENT/PLAN:   Encounter for annual physical exam Reviewed past medical, surgical, and social history.  Medications updated in epic.  Health maintenance reviewed and obtained pap smear with gc/ch/trich testing (asymptomatic). Encouraged working towards a well-balanced diet and staying physically active.  Epidermoid cyst Overall appearance of lesion on her lower back appears consistent with an epidermoid cyst, states that has been present and growing since approximately 2014.  Not painful, but it is bothersome to her that has grown in size.  She would like to have this removed, will refer to dermatology.  Normocytic anemia Known history of iron  deficiency anemia.  She has not been able to tolerate ferrous sulfate.  Recheck CBC and ferritin today.  We will discuss trialing prenatals to if she is able to tolerate this better vs IV infusion.    Follow up in 3 months to check in or sooner if indicated.   [name], [place]

## 2020-07-15 ENCOUNTER — Other Ambulatory Visit (HOSPITAL_COMMUNITY)
Admission: RE | Admit: 2020-07-15 | Discharge: 2020-07-15 | Disposition: A | Discharge status: Home / Self Care | Payer: Medicaid Other | Source: Ambulatory Visit | Attending: Family Medicine | Admitting: Family Medicine

## 2020-07-15 ENCOUNTER — Ambulatory Visit (INDEPENDENT_AMBULATORY_CARE_PROVIDER_SITE_OTHER): Payer: Medicaid Other | Admitting: Family Medicine

## 2020-07-15 ENCOUNTER — Other Ambulatory Visit: Payer: Self-pay

## 2020-07-15 ENCOUNTER — Encounter: Payer: Self-pay | Admitting: Family Medicine

## 2020-07-15 VITALS — BP 106/62 | HR 84 | Ht 68.0 in | Wt 149.0 lb

## 2020-07-15 DIAGNOSIS — L720 Epidermal cyst: Secondary | ICD-10-CM | POA: Insufficient documentation

## 2020-07-15 DIAGNOSIS — D649 Anemia, unspecified: Secondary | ICD-10-CM

## 2020-07-15 DIAGNOSIS — D509 Iron deficiency anemia, unspecified: Secondary | ICD-10-CM

## 2020-07-15 DIAGNOSIS — Z124 Encounter for screening for malignant neoplasm of cervix: Secondary | ICD-10-CM

## 2020-07-15 DIAGNOSIS — E559 Vitamin D deficiency, unspecified: Secondary | ICD-10-CM

## 2020-07-15 DIAGNOSIS — Z0000 Encounter for general adult medical examination without abnormal findings: Secondary | ICD-10-CM

## 2020-07-15 MED ORDER — CHOLECALCIFEROL 50 MCG (2000 UT) PO CAPS: 1.0000 | ORAL_CAPSULE | Freq: Every day | ORAL | 2 refills | Status: DC

## 2020-07-15 NOTE — Assessment & Plan Note (Signed)
Reviewed past medical, surgical, and social history.  Medications updated in epic.  Health maintenance reviewed and obtained pap smear with gc/ch/trich testing (asymptomatic). Encouraged working towards a well-balanced diet and staying physically active.

## 2020-07-15 NOTE — Assessment & Plan Note (Signed)
Refill vitamin D supplement.

## 2020-07-15 NOTE — Assessment & Plan Note (Signed)
Known history of iron deficiency anemia.  She has not been able to tolerate ferrous sulfate.  Recheck CBC and ferritin today.  We will discuss trialing prenatals to if she is able to tolerate this better vs IV infusion.

## 2020-07-15 NOTE — Patient Instructions (Signed)
It was wonderful to see you today.  We will be getting some labs, I will send you a MyChart message or give you a call.  Please keep up the great work with staying physically active, intervals like we discussed with your walking.  Encouraged to work towards 1-2 goals weekly for adding well balanced foods into your diet.   I am very excited you will be working with a therapist--I hope this a great help for you.

## 2020-07-15 NOTE — Assessment & Plan Note (Signed)
Overall appearance of lesion on her lower back appears consistent with an epidermoid cyst, states that has been present and growing since approximately 2014.  Not painful, but it is bothersome to her that has grown in size.  She would like to have this removed, will refer to dermatology.

## 2020-07-16 LAB — CBC WITH DIFFERENTIAL/PLATELET
Basophils Absolute: 0.1 10*3/uL (ref 0.0–0.2)
Basos: 1 %
EOS (ABSOLUTE): 0.2 10*3/uL (ref 0.0–0.4)
Eos: 3 %
Hematocrit: 31.2 % — ABNORMAL LOW (ref 34.0–46.6)
Hemoglobin: 9.6 g/dL — ABNORMAL LOW (ref 11.1–15.9)
Immature Grans (Abs): 0 10*3/uL (ref 0.0–0.1)
Immature Granulocytes: 0 %
Lymphocytes Absolute: 1.8 10*3/uL (ref 0.7–3.1)
Lymphs: 29 %
MCH: 23 pg — ABNORMAL LOW (ref 26.6–33.0)
MCHC: 30.8 g/dL — ABNORMAL LOW (ref 31.5–35.7)
MCV: 75 fL — ABNORMAL LOW (ref 79–97)
Monocytes Absolute: 0.5 10*3/uL (ref 0.1–0.9)
Monocytes: 9 %
Neutrophils Absolute: 3.6 10*3/uL (ref 1.4–7.0)
Neutrophils: 58 %
Platelets: 321 10*3/uL (ref 150–450)
RBC: 4.18 x10E6/uL (ref 3.77–5.28)
RDW: 16.4 % — ABNORMAL HIGH (ref 11.7–15.4)
WBC: 6.2 10*3/uL (ref 3.4–10.8)

## 2020-07-16 LAB — FERRITIN: Ferritin: 4 ng/mL — ABNORMAL LOW (ref 15–150)

## 2020-07-18 LAB — CYTOLOGY - PAP
Chlamydia: NEGATIVE
Comment: NEGATIVE
Comment: NEGATIVE
Comment: NEGATIVE
Comment: NORMAL
Diagnosis: NEGATIVE
High risk HPV: NEGATIVE
Neisseria Gonorrhea: NEGATIVE
Trichomonas: NEGATIVE

## 2020-07-28 ENCOUNTER — Other Ambulatory Visit: Payer: Self-pay | Admitting: Family Medicine

## 2020-07-29 ENCOUNTER — Telehealth: Payer: Self-pay

## 2020-07-29 ENCOUNTER — Other Ambulatory Visit: Payer: Self-pay | Admitting: Family Medicine

## 2020-07-29 DIAGNOSIS — D509 Iron deficiency anemia, unspecified: Secondary | ICD-10-CM

## 2020-07-29 NOTE — Telephone Encounter (Signed)
Order has been placed however will need to call infusion center on Monday to have this scheduled as they are already closed for today.   Red team, can we please call the infusion center on Monday to schedule? I believe she prefers morning appointments if possible.

## 2020-07-29 NOTE — Telephone Encounter (Signed)
Patient calls nurse line requesting to speak with provider regarding starting iron infusion and scheduling an appointment.   Please advise.   Talbot Grumbling, RN

## 2020-08-01 ENCOUNTER — Other Ambulatory Visit: Payer: Self-pay

## 2020-08-01 ENCOUNTER — Ambulatory Visit (INDEPENDENT_AMBULATORY_CARE_PROVIDER_SITE_OTHER): Payer: Medicaid Other | Admitting: Family Medicine

## 2020-08-01 ENCOUNTER — Telehealth: Payer: Self-pay | Admitting: Family Medicine

## 2020-08-01 ENCOUNTER — Encounter: Payer: Self-pay | Admitting: Family Medicine

## 2020-08-01 ENCOUNTER — Telehealth: Payer: Self-pay

## 2020-08-01 VITALS — BP 110/62 | HR 76 | Wt 149.0 lb

## 2020-08-01 DIAGNOSIS — R631 Polydipsia: Secondary | ICD-10-CM | POA: Diagnosis present

## 2020-08-01 DIAGNOSIS — K219 Gastro-esophageal reflux disease without esophagitis: Secondary | ICD-10-CM | POA: Diagnosis not present

## 2020-08-01 LAB — POCT UA - MICROSCOPIC ONLY

## 2020-08-01 LAB — POCT URINALYSIS DIP (MANUAL ENTRY)
Bilirubin, UA: NEGATIVE
Blood, UA: NEGATIVE
Glucose, UA: NEGATIVE mg/dL
Ketones, POC UA: NEGATIVE mg/dL
Nitrite, UA: NEGATIVE
Protein Ur, POC: NEGATIVE mg/dL
Spec Grav, UA: 1.02 (ref 1.010–1.025)
Urobilinogen, UA: 0.2 E.U./dL
pH, UA: 7.5 (ref 5.0–8.0)

## 2020-08-01 MED ORDER — FAMOTIDINE 20 MG PO TABS: 20.0000 mg | ORAL_TABLET | Freq: Two times a day (BID) | ORAL | 0 refills | Status: DC

## 2020-08-01 NOTE — Telephone Encounter (Signed)
Patient calls nurse line requesting to schedule appointment for left sided chest discomfort. Patient reports that pain is under ribs and that pain feels similar to pancreatitis pain that she has experienced in the past. Patient reports that pain is intermittent and occurs mostly after she eats. Patient states that pain onset was Saturday morning. Denies [facility] or current chest pain.Patient is requesting appointment for follow up and to check Lipase levels.   Advised of ED precautions. Patient states that she does not want to go to the ED. Patient scheduled for appointment this afternoon with Dr. Caron Presume. Reinforced ED precautions for worsening pain or SHOB.   Talbot Grumbling, RN

## 2020-08-01 NOTE — Patient Instructions (Signed)
It was great seeing you today.  I think your symptoms are most likely related to acid reflux and I would like to start you on a medication called Pepcid.  You will take this twice daily for the next 2 weeks and I would like for you to follow-up with your new primary care provider Dr. Arby Barrette.  An appointment has been scheduled for 7/6 at 10:10 AM.  If your symptoms worsen, you have shortness of breath, chest pain please seek medical attention at the emergency room.  Regarding your iron infusion that appointment has also been scheduled and is below.  If you have any questions or concerns please feel free to call the clinic.  I hope you have a wonderful afternoon!  Gastroesophageal Reflux Disease, Adult  Gastroesophageal reflux (GER) happens when acid from the stomach flows up into the tube that connects the mouth and the stomach (esophagus). Normally, food travels down the esophagus and stays in the stomach to be digested. With GER, food and stomach acid sometimes move back up into theesophagus. You may have a disease called gastroesophageal reflux disease (GERD) if the reflux: Happens often. Causes frequent or very bad symptoms. Causes problems such as damage to the esophagus. When this happens, the esophagus becomes sore and swollen. Over time, GERD can make small holes (ulcers) in the lining of the esophagus. What are the causes? This condition is caused by a problem with the muscle between the esophagus and the stomach. When this muscle is weak or not normal, it does not close properlyto keep food and acid from coming back up from the stomach. The muscle can be weak because of: Tobacco use. Pregnancy. Having a certain type of hernia (hiatal hernia). Alcohol use. Certain foods and drinks, such as coffee, chocolate, onions, and peppermint. What increases the risk? Being overweight. Having a disease that affects your connective tissue. Taking NSAIDs, such a ibuprofen. What are the signs or  symptoms? Heartburn. Difficult or painful swallowing. The feeling of having a lump in the throat. A bitter taste in the mouth. Bad breath. Having a lot of saliva. Having an upset or bloated stomach. Burping. Chest pain. Different conditions can cause chest pain. Make sure you see your doctor if you have chest pain. Shortness of breath or wheezing. A long-term cough or a cough at night. Wearing away of the surface of teeth (tooth enamel). Weight loss. How is this treated? Making changes to your diet. Taking medicine. Having surgery. Treatment will depend on how bad your symptoms are. Follow these instructions at home: Eating and drinking  Follow a diet as told by your doctor. You may need to avoid foods and drinks such as: Coffee and tea, with or without caffeine. Drinks that contain alcohol. Energy drinks and sports drinks. Bubbly (carbonated) drinks or sodas. Chocolate and cocoa. Peppermint and mint flavorings. Garlic and onions. Horseradish. Spicy and acidic foods. These include peppers, chili powder, curry powder, vinegar, hot sauces, and BBQ sauce. Citrus fruit juices and citrus fruits, such as oranges, lemons, and limes. Tomato-based foods. These include red sauce, chili, salsa, and pizza with red sauce. Fried and fatty foods. These include donuts, french fries, potato chips, and high-fat dressings. High-fat meats. These include hot dogs, rib eye steak, sausage, ham, and bacon. High-fat dairy items, such as whole milk, butter, and cream cheese. Eat small meals often. Avoid eating large meals. Avoid drinking large amounts of liquid with your meals. Avoid eating meals during the 2-3 hours before bedtime. Avoid lying down  right after you eat. Do not exercise right after you eat.  Lifestyle  Do not smoke or use any products that contain nicotine or tobacco. If you need help quitting, ask your doctor. Try to lower your stress. If you need help doing this, ask your  doctor. If you are overweight, lose an amount of weight that is healthy for you. Ask your doctor about a safe weight loss goal.  General instructions Pay attention to any changes in your symptoms. Take over-the-counter and prescription medicines only as told by your doctor. Do not take aspirin, ibuprofen, or other NSAIDs unless your doctor says it is okay. Wear loose clothes. Do not wear anything tight around your waist. Raise (elevate) the head of your bed about 6 inches (15 cm). You may need to use a wedge to do this. Avoid bending over if this makes your symptoms worse. Keep all follow-up visits. Contact a doctor if: You have new symptoms. You lose weight and you do not know why. You have trouble swallowing or it hurts to swallow. You have wheezing or a cough that keeps happening. You have a hoarse voice. Your symptoms do not get better with treatment. Get help right away if: You have sudden pain in your arms, neck, jaw, teeth, or back. You suddenly feel sweaty, dizzy, or light-headed. You have chest pain or shortness of breath. You vomit and the vomit is green, yellow, or black, or it looks like blood or coffee grounds. You faint. Your poop (stool) is red, bloody, or black. You cannot swallow, drink, or eat. These symptoms may represent a serious problem that is an emergency. Do not wait to see if the symptoms will go away. Get medical help right away. Call your local emergency services (911 in the [place] Do not drive yourself to the hospital. Summary If a person has gastroesophageal reflux disease (GERD), food and stomach acid move back up into the esophagus and cause symptoms or problems such as damage to the esophagus. Treatment will depend on how bad your symptoms are. Follow a diet as told by your doctor. Take all medicines only as told by your doctor. This information is not intended to replace advice given to you by your health care provider. Make sure you discuss any  questions you have with your healthcare provider. Document Revised: 08/10/2019 Document Reviewed: 08/10/2019 Elsevier Patient Education  [employer].

## 2020-08-01 NOTE — Telephone Encounter (Signed)
Agree with an appointment today, thank you!   [name], DO

## 2020-08-01 NOTE — Telephone Encounter (Signed)
Received call back from infusion center. Scheduled for 6/29 at 9:00am for IV iron infusion at the outpatient center.   Can we please let the patient know this, victor can you possibly inform her during her appointment this afternoon?   [name], DO

## 2020-08-01 NOTE — Progress Notes (Signed)
    SUBJECTIVE:   CHIEF COMPLAINT / HPI:   Intermittent chest pain Patient reports that she has been having chest pain only when she drinks liquids starting approximately 3 days ago.  This pain does not occur when she is exercising, moving, sitting still, breathing.  The only eliciting factor that she can identify is when she drinks liquids.  Denies any history of reflux that she knows of.  Denies any tenderness to palpation of the chest.  Denies any shortness of breath.  Reports that she feels the pain deep in her chest.  Has not taken any medications for this because she does not know what is causing it.  Does not feel like it is her heart or her lungs.  Patient does report that she is had increased urination but denies any burning with urination or other UTI symptoms.  Her main concern is that in the past she had a episode of pancreatitis and she is worried that this may be pancreatitis again.  She reports that it did not feel like this before but she is just worried.   OBJECTIVE:   BP 110/62   Pulse 76   Wt 149 lb (67.6 kg)   SpO2 98%   BMI 22.66 kg/m   General: Well-appearing 35 year old female in no acute distress Cardiac: Regular rate and rhythm, no murmurs appreciated Respiratory: Normal work of breathing, lungs clear to auscultation bilaterally, no wheezes appreciated MSK: No tenderness to palpation on chest wall, no gross abnormalities   ASSESSMENT/PLAN:   No problem-specific Assessment & Plan notes found for this encounter.     Gifford Shave, MD [place]

## 2020-08-01 NOTE — Telephone Encounter (Signed)
Called outpatient infusion center to schedule an appointment for her IV iron. No answer and left voicemail to my personal contact to get this scheduled.   [name], DO

## 2020-08-02 DIAGNOSIS — K219 Gastro-esophageal reflux disease without esophagitis: Secondary | ICD-10-CM | POA: Insufficient documentation

## 2020-08-02 NOTE — Assessment & Plan Note (Signed)
Patient with signs and symptoms consistent with reflux.  She has not taken any medications for this.  Prescribed Pepcid for her to take twice daily for 2 weeks.  She will follow-up with her primary care provider in 2 weeks to discuss further need for medications.  Differential includes cardiac origin which I think is very unlikely given the presentation as well as the physical exam.  PE is also on the differential but the patient is low risk and has no other indications that this may be the cause.  Strict ED and return precautions given and patient is agreeable to this.

## 2020-08-03 ENCOUNTER — Ambulatory Visit (INDEPENDENT_AMBULATORY_CARE_PROVIDER_SITE_OTHER): Payer: Medicaid Other

## 2020-08-03 ENCOUNTER — Other Ambulatory Visit: Payer: Self-pay

## 2020-08-03 ENCOUNTER — Telehealth: Payer: Self-pay

## 2020-08-03 DIAGNOSIS — Z111 Encounter for screening for respiratory tuberculosis: Secondary | ICD-10-CM | POA: Diagnosis not present

## 2020-08-03 NOTE — Telephone Encounter (Signed)
Patient brings employment physical with her to scheduled nurse visit this morning.   Physical form is in PCP box for completion.   Patient had PPD test done today and will return on 6/24 to have site read. Patient wishes to pick up form then.   I advised her this may not be done as we ask for 7 days for forms.   Please return to RN team once completed.

## 2020-08-03 NOTE — Progress Notes (Signed)
Patient presents for PPD test for employment. Tuberculin skin test applied to left ventral forearm.  Patient to return on 6/24 @945am  to have site read.  Patient also dropped off her employee physical.  Physical placed in PCP box for completion.  I informed her we may not have this ready for her by Friday.

## 2020-08-05 ENCOUNTER — Ambulatory Visit (INDEPENDENT_AMBULATORY_CARE_PROVIDER_SITE_OTHER): Payer: Medicaid Other

## 2020-08-05 ENCOUNTER — Other Ambulatory Visit: Payer: Self-pay

## 2020-08-05 DIAGNOSIS — Z0184 Encounter for antibody response examination: Secondary | ICD-10-CM

## 2020-08-05 LAB — TB SKIN TEST
Induration: 0 mm
TB Skin Test: NEGATIVE

## 2020-08-05 NOTE — Telephone Encounter (Signed)
Patient returned to clinic today for PPD read. Per paperwork, patient needs proof of Hep B, MMR and Tdap. Patient received Tdap vaccination in 2017, however, we do not have record of Hep B or MMR.  Spoke with Dr. Higinio Plan. Provided verbal authorization to place lab orders for Hep B and MMR titers.   Patient signed release of medical information for paperwork to be faxed to [employer] at [phone]. Patient request that paperwork be faxed after lab results have came back.   Talbot Grumbling, RN

## 2020-08-05 NOTE — Progress Notes (Signed)
Patient is here for a PPD read.  It was placed on 08/03/20 in the left forearm @ 0900 am/pm.    PPD RESULTS:  Result: negative Induration: 0 mm  Letter created and given to patient for documentation purposes. Talbot Grumbling, RN

## 2020-08-05 NOTE — Telephone Encounter (Signed)
Paperwork has been placed in purple folder in RN office. Will complete once labs have resulted.   Talbot Grumbling, RN

## 2020-08-06 LAB — MEASLES/MUMPS/RUBELLA IMMUNITY
MUMPS ABS, IGG: 46.1 AU/mL (ref >10.9)
RUBEOLA AB, IGG: 188 AU/mL (ref >16.4)
Rubella Antibodies, IGG: 11.2 index (ref >0.99)

## 2020-08-06 LAB — HEPATITIS B SURFACE ANTIBODY, QUANTITATIVE: Hepatitis B Surf Ab Quant: 62.7 m[IU]/mL (ref >9.9)

## 2020-08-08 ENCOUNTER — Encounter: Payer: Self-pay | Admitting: Family Medicine

## 2020-08-10 ENCOUNTER — Ambulatory Visit (HOSPITAL_COMMUNITY)
Admission: RE | Admit: 2020-08-10 | Discharge: 2020-08-10 | Disposition: A | Discharge status: Home / Self Care | Payer: Medicaid Other | Source: Ambulatory Visit | Attending: Family Medicine | Admitting: Family Medicine

## 2020-08-10 DIAGNOSIS — D509 Iron deficiency anemia, unspecified: Secondary | ICD-10-CM | POA: Diagnosis not present

## 2020-08-10 MED ORDER — SODIUM CHLORIDE 0.9 % IV SOLN
510.0000 mg | Freq: Once | INTRAVENOUS | Status: AC
  Administered 2020-08-10: 510 mg via INTRAVENOUS
  Filled 2020-08-10: qty 510

## 2020-08-16 NOTE — Progress Notes (Deleted)
    SUBJECTIVE:   CHIEF COMPLAINT / HPI:   Ms. Margaret [name] is a 35 yo who presents with epigastric pain.   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  ***  ASSESSMENT/PLAN:   No problem-specific Assessment & Plan notes found for this encounter.     [place]   {    This will disappear when note is signed, click to select method of visit    :1}

## 2020-08-17 ENCOUNTER — Ambulatory Visit: Payer: Medicaid Other | Admitting: Family Medicine

## 2020-08-19 NOTE — Telephone Encounter (Signed)
Delay in documentation. Paperwork faxed to patient's employer by [facility].   Talbot Grumbling, RN

## 2020-08-23 ENCOUNTER — Other Ambulatory Visit: Payer: Self-pay

## 2020-08-23 ENCOUNTER — Ambulatory Visit (INDEPENDENT_AMBULATORY_CARE_PROVIDER_SITE_OTHER): Payer: Medicaid Other | Admitting: Neurology

## 2020-08-23 ENCOUNTER — Encounter: Payer: Self-pay | Admitting: Neurology

## 2020-08-23 VITALS — BP 100/62 | HR 81 | Ht 68.0 in | Wt 150.0 lb

## 2020-08-23 DIAGNOSIS — G40309 Generalized idiopathic epilepsy and epileptic syndromes, not intractable, without status epilepticus: Secondary | ICD-10-CM | POA: Diagnosis not present

## 2020-08-23 MED ORDER — LAMOTRIGINE 150 MG PO TABS: 150.0000 mg | ORAL_TABLET | Freq: Two times a day (BID) | ORAL | 3 refills | Status: DC
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
## 2020-08-23 NOTE — Progress Notes (Signed)
NEUROLOGY FOLLOW UP OFFICE NOTE  Kenzee Bassin [id number] [birth year]  HISTORY OF PRESENT ILLNESS: I had the pleasure of seeing Neilani Duffee in follow-up in the neurology clinic on 08/23/2020.  The patient was last seen a year ago for seizures suggestive of juvenile myoclonic epilepsy. She has been seizure-free since 2012 since initiation of Lamotrigine 150mg  BID. She mostly has myoclonic jerks in her head when stressed out or sleep deprived. No staring/unresponsive episodes, olfactory/gustatory hallucinations, focal numbness/tingling/weakness. She has infrequent headaches, usually around the time of her menstrual period or stress. No dizziness, vision changes, no falls. She has occasional sleep difficulties that are stress-related. She works as a Engineer, site. She continues to report anger issues, being short-tempered. She sees a therapist which helps some. She recently had a bout of pancreatitis and wonders if this is related to the Lamotrigine. She has had iron infusions for anemia.   History on Initial Assessment 09/30/2019: This is a 35 year old right-handed woman presenting to establish care for epilepsy. Seizures started in 2003/2004 while she was still living in [place]. There was no prior warning, she recalls cooking, then waking up on the bed. She was told she had full body shaking and was staring for a long time after. She slept all day afterwards. She was unable to seek medical care at that time. She had another seizure in 2006 while at [employer] in [place], she was sitting then suddenly had a GTC. The next seizure occurred in 2010 when she was studying in the [place]. An EEG and MRI were recommended, she had these studies in [place] and was told MRI brain was fine but the EEG showed abnormal activity. She was started on Levetiracetam in [place] but had another seizure and was started on Lamotrigine 150mg  BID in 2012. She denies any further convulsions since then. No side effects on  lamotrigine. She lives with her husband and 2 children (ages 37 and 60), and denies any staring/unresponsive episodes, gaps in time, olfactory/gustatory hallucinations. She has myoclonic jerks affecting her head when she is stressed out, sleep deprived, but most noticeable when she is concentrating a lot on her laptop. Extremities are not affected. She recalls prior to one of her seizures, she had a head jerk, then 5-10 seconds later she had a GTC and woke up in the hospital. Last time she recalls having a head jerk was in Jan/Feb 2021. She denies any focal numbness/tingling/weakness. Once or twice she woke up with blood on her pillow, her husband has not mentioned any nocturnal convulsions. She has headaches around the time of her period. She has occasional neck pain, occasional constipation. She has noticed for the past couple of months, she gets angry easily. She has a history of being short-tempered but now little things happen and she screams at her children. Sleep is good. She denies any dizziness, diplopia, dysarthria/dysphagia, back pain, bladder dysfunction.She works as a Community education officer at [employer]. No current pregnancy plans, she is not on contraception.   Epilepsy Risk Factors:  Her maternal uncle and maternal first cousin have seizures. Otherwise she had a normal birth and early development.  There is no history of febrile convulsions, CNS infections such as meningitis/encephalitis, significant traumatic brain injury, neurosurgical procedures.  Prior AEDs: Levetiracetam  Diagnostic Data: Patient reports having a repeat abnormal EEG done in 2016, records will be requested from Dr. Pattricia Boss in [facility].  PAST MEDICAL HISTORY: Past Medical History:  Diagnosis Date   Appendicitis    Coccyx pain 10/11/2019  Constipation 10/08/2019    MEDICATIONS: Current Outpatient Medications on File Prior to Visit  Medication Sig Dispense Refill   Cholecalciferol 50 MCG (2000 UT) CAPS Take 1 capsule (2,000 Units total) by  mouth daily. 30 capsule 2   famotidine (PEPCID) 20 MG tablet Take 1 tablet (20 mg total) by mouth 2 (two) times daily. 28 tablet 0   ibuprofen (ADVIL) 600 MG tablet Take 1 tablet (600 mg total) by mouth every 8 (eight) hours as needed. 15 tablet 0   lamoTRIgine (LAMICTAL) 150 MG tablet Take 1 tablet (150 mg total) by mouth 2 (two) times daily. 180 tablet 3   polyethylene glycol powder (GLYCOLAX/MIRALAX) 17 GM/SCOOP powder Take 17 g by mouth 2 (two) times daily as needed. 3350 g 1   No current facility-administered medications on file prior to visit.    ALLERGIES: No Known Allergies  FAMILY HISTORY: History reviewed. No pertinent family history.  SOCIAL HISTORY: Social History   Socioeconomic History   Marital status: Married    Spouse name: Not on file   Number of children: 2   Years of education: Not on file   Highest education level: Not on file  Occupational History   Not on file  Tobacco Use   Smoking status: Never   Smokeless tobacco: Never  Vaping Use   Vaping Use: Never used  Substance and Sexual Activity   Alcohol use: Never   Drug use: Never   Sexual activity: Not on file  Other Topics Concern   Not on file  Social History Narrative   Right Handed   One Story Home   Drinks caffeine   Social Determinants of Health   Financial Resource Strain: Not on file  Food Insecurity: Not on file  Transportation Needs: Not on file  Physical Activity: Not on file  Stress: Not on file  Social Connections: Not on file  Intimate Partner Violence: Not on file     PHYSICAL EXAM: Vitals:   08/23/20 1435  BP: 100/62  Pulse: 81  SpO2: 98%   General: No acute distress Head:  Normocephalic/atraumatic Skin/Extremities: No rash, no edema Neurological Exam: alert and awake. No aphasia or dysarthria. Fund of knowledge is appropriate.  Recent and remote memory are intact.  Attention and concentration are normal.   Cranial nerves: Pupils equal, round. Extraocular movements  intact with no nystagmus. Visual fields full.  No facial asymmetry.  Motor: Bulk and tone normal, muscle strength 5/5 throughout with no pronator drift.   Finger to nose testing intact.  Gait narrow-based and steady, able to tandem walk adequately.  Romberg negative.   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 She reports having an abnormal EEG in 2010 and most recently in 2016. She reports having a normal MRI brain. Her last GTC was in 2012. She has rare myoclonic jerks. She is overall doing well on Lamotrigine 150mg  BID. She wonders if bout of pancreatitis is related to Lamotrigine, literature review indicates this is less likely. She asks about reducing dose, we discussed reducing to 100mg  BID understanding risk of breakthrough seizure with any medication adjustment, she is not willing to take the risk and will continue on current dose. She continues to report anger issues despite psychotherapy and will discuss medications with her PCP. She is aware of [place] driving laws to stop driving after a seizure until 6 months seizure-free. Follow-up in 1 year, call for any  changes.   Thank you for allowing me to participate in her care.  Please do not hesitate to call for any questions or concerns.   Ellouise Newer, M.D.   CC: Dr. Arby Barrette

## 2020-09-05 ENCOUNTER — Ambulatory Visit: Payer: Medicaid Other | Admitting: Family Medicine

## 2020-09-19 ENCOUNTER — Ambulatory Visit: Payer: Medicaid Other | Admitting: Neurology

## 2020-09-29 ENCOUNTER — Ambulatory Visit: Payer: Medicaid Other | Admitting: Neurology

## 2020-09-30 ENCOUNTER — Ambulatory Visit: Payer: Medicaid Other | Admitting: Family Medicine

## 2020-10-13 NOTE — Progress Notes (Deleted)
    SUBJECTIVE:   CHIEF COMPLAINT / HPI:   ***  PERTINENT  PMH / PSH: ***  OBJECTIVE:   There were no vitals taken for this visit.  ***  ASSESSMENT/PLAN:   No problem-specific Assessment & Plan notes found for this encounter.     Gerlene Fee, [place]

## 2020-10-14 ENCOUNTER — Ambulatory Visit: Payer: Medicaid Other | Admitting: Family Medicine

## 2020-10-26 ENCOUNTER — Ambulatory Visit: Payer: Medicaid Other | Admitting: Family Medicine

## 2020-12-15 ENCOUNTER — Encounter: Payer: Self-pay | Admitting: Family Medicine

## 2020-12-15 ENCOUNTER — Ambulatory Visit (INDEPENDENT_AMBULATORY_CARE_PROVIDER_SITE_OTHER): Payer: Medicaid Other | Admitting: Family Medicine

## 2020-12-15 ENCOUNTER — Other Ambulatory Visit: Payer: Self-pay

## 2020-12-15 VITALS — BP 119/67 | HR 85 | Ht 68.0 in | Wt 154.2 lb

## 2020-12-15 DIAGNOSIS — D649 Anemia, unspecified: Secondary | ICD-10-CM | POA: Diagnosis not present

## 2020-12-15 DIAGNOSIS — L720 Epidermal cyst: Secondary | ICD-10-CM | POA: Diagnosis present

## 2020-12-15 NOTE — Progress Notes (Signed)
    SUBJECTIVE:   CHIEF COMPLAINT / HPI:   Margaret [name] is a 35 yo who presents to check on abscess on her back. She states she first noticed it in 2013 but since has progressively gotten larger. Not painful but more so in the way and is rubbing against things. She was seen in the clinic for this previously and referred to Dermatology. Patient states she did not care for the Dermatologist she saw and requests surgery referral.   Additionally she requests to get her iron checked. She has a history of iron deficiency anemia and received 1 iron transfusion as she is not able to toelrate oral iron due to stomach upset   OBJECTIVE:   BP 119/67   Pulse 85   Ht 5\' 8"  (1.727 m)   Wt 154 lb 3.2 oz (69.9 kg)   SpO2 100%   BMI 23.45 kg/m    General: alert, NAD CV: RRR no murmurs Resp: CTAB normal WOB GI: soft, non distended  Derm: approx 3x3cm non mobile, non erythematous cyst. Non tender to palpation     ASSESSMENT/PLAN:   No problem-specific Assessment & Plan notes found for this encounter.   Epidermoid cyst Present since 2013 but has progressively gotten larger. Does not appear to be infected. Was previously seen in clinic for this and was referred to Derm whom she saw but would prefer a surgery referral. Placed referral and gave return precautions.  Iron deficiency anemia 5 months ago hgb 9.6, ferritin 4. She received an iron transfusion at the time since she was unable to tolerate po iron. Requests to check it again. Asymptomatic. Will assess and transfuse if needed.   [place]

## 2020-12-15 NOTE — Patient Instructions (Addendum)
It was great seeing you today!  You were seen for the cyst on your back and we placed a referral to general surgery for removal. If you do not hear anything back within the next 2 weeks, please call the clinic to check on the referral.  We are also checking your blood and iron levels.  I will call you if anything is abnormal, or will send a MyChart message if normal.  Feel free to call with any questions or concerns at any time, at [phone].   Take care,  Dr. Shary Key [facility]

## 2020-12-16 LAB — CBC WITH DIFFERENTIAL/PLATELET
Basophils Absolute: 0.1 10*3/uL (ref 0.0–0.2)
Basos: 1 %
EOS (ABSOLUTE): 0.2 10*3/uL (ref 0.0–0.4)
Eos: 3 %
Hematocrit: 33.5 % — ABNORMAL LOW (ref 34.0–46.6)
Hemoglobin: 10.6 g/dL — ABNORMAL LOW (ref 11.1–15.9)
Immature Grans (Abs): 0 10*3/uL (ref 0.0–0.1)
Immature Granulocytes: 0 %
Lymphocytes Absolute: 2 10*3/uL (ref 0.7–3.1)
Lymphs: 26 %
MCH: 27.3 pg (ref 26.6–33.0)
MCHC: 31.6 g/dL (ref 31.5–35.7)
MCV: 86 fL (ref 79–97)
Monocytes Absolute: 0.6 10*3/uL (ref 0.1–0.9)
Monocytes: 8 %
Neutrophils Absolute: 4.7 10*3/uL (ref 1.4–7.0)
Neutrophils: 62 %
Platelets: 243 10*3/uL (ref 150–450)
RBC: 3.88 x10E6/uL (ref 3.77–5.28)
RDW: 12.7 % (ref 11.7–15.4)
WBC: 7.6 10*3/uL (ref 3.4–10.8)

## 2020-12-16 LAB — FERRITIN: Ferritin: 9 ng/mL — ABNORMAL LOW (ref 15–150)

## 2021-01-09 ENCOUNTER — Telehealth: Payer: Self-pay | Admitting: Neurology

## 2021-01-09 NOTE — Telephone Encounter (Signed)
Pt called in stating her job is going to be sending in an accomodation form for her that she will need by 01/15/21. She will need another physician to fill it out since Dr. Delice Lesch is out. The form will be coming by fax.

## 2021-01-09 NOTE — Telephone Encounter (Signed)
Pt called and informed that other Drs in the office do not fill out form for work for other Dr's and that Dr Delice Lesch will fill out the paperwork when she returns,

## 2021-01-25 ENCOUNTER — Telehealth: Payer: Self-pay

## 2021-01-25 NOTE — Telephone Encounter (Signed)
-----   Message from Cameron Sprang, MD sent at 01/23/2021  2:18 PM EST ----- Regarding: accommodations I have her form but I need to know what accommodations she is requesting from them. Thanks

## 2021-01-25 NOTE — Telephone Encounter (Signed)
Pt called no answer when she calls back we need to know what accommodations she is requesting from work so Dr Delice Lesch can do her paper work

## 2021-01-25 NOTE — Telephone Encounter (Signed)
The pt called in. She stated her job is scheduling her at night. This has been messing with her sleep, so she is wanting a daytime shift. One of her triggers is lack of sleep, stress, and hunger. She also stated she needs a letter stating she has epilepsy and sometimes she needs accomodation.

## 2021-01-26 NOTE — Telephone Encounter (Signed)
Done, thanks

## 2021-01-26 NOTE — Telephone Encounter (Signed)
Given to front desk for payment then we can fax

## 2021-01-27 NOTE — Telephone Encounter (Signed)
Waiting or payment

## 2021-01-30 NOTE — Telephone Encounter (Signed)
Given to front desk for payment then we can fax

## 2021-02-01 ENCOUNTER — Telehealth: Payer: Self-pay | Admitting: Neurology

## 2021-02-01 NOTE — Telephone Encounter (Signed)
Pt called in stating she needs a letter from Dr. Delice Lesch stating she has epilepsy and she is on medication. She needs accommodations sometimes when she is having symptoms.

## 2021-02-01 NOTE — Telephone Encounter (Signed)
Waiting for payment so we can fax paperwork

## 2021-02-02 ENCOUNTER — Encounter: Payer: Self-pay | Admitting: Neurology

## 2021-02-02 NOTE — Telephone Encounter (Signed)
Letter done, thanks.

## 2021-02-02 NOTE — Telephone Encounter (Signed)
Will fax paper work

## 2021-02-02 NOTE — Telephone Encounter (Signed)
Pt called no answer left a voice mail that letter will be up front for her to pick up and that paperwork for her job was faxed

## 2021-02-02 NOTE — Telephone Encounter (Signed)
Paperwork is faxed to [facility]

## 2021-03-06 ENCOUNTER — Telehealth: Payer: Self-pay | Admitting: Neurology

## 2021-03-06 NOTE — Telephone Encounter (Signed)
noted 

## 2021-03-06 NOTE — Telephone Encounter (Signed)
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 She said she would feel safer working from home as she has a 45 minute drive to work.  Patient stated she is bringing by some accomodation forms tomorrow morning but wanted this documented in her chart.

## 2021-03-07 ENCOUNTER — Ambulatory Visit: Payer: Medicaid Other | Admitting: Family Medicine

## 2021-03-07 NOTE — Progress Notes (Deleted)
° ° ° °  SUBJECTIVE:   CHIEF COMPLAINT / HPI:   Margaret [name] is a 36 y.o. female presents for A1 check    ***  [place] Office Visit from 12/15/2020 in [place]  PHQ-9 Total Score 7        Health Maintenance Due  Topic   COVID-19 Vaccine (4 - Booster for Moderna series)   INFLUENZA VACCINE       PERTINENT  PMH / PSH: seizure disorder, subclinical hypothyroidism  OBJECTIVE:   There were no vitals taken for this visit.   General: Alert, no acute distress Cardio: Normal S1 and S2, RRR, no r/m/g Pulm: CTAB, normal work of breathing Abdomen: Bowel sounds normal. Abdomen soft and non-tender.  Extremities: No peripheral edema.  Neuro: Cranial nerves grossly intact   ASSESSMENT/PLAN:   No problem-specific Assessment & Plan notes found for this encounter.    Lattie Haw, MD PGY-3 [place]

## 2021-03-09 DIAGNOSIS — Z0279 Encounter for issue of other medical certificate: Secondary | ICD-10-CM

## 2021-03-10 ENCOUNTER — Other Ambulatory Visit: Payer: Self-pay

## 2021-03-10 ENCOUNTER — Telehealth: Payer: Self-pay

## 2021-03-10 ENCOUNTER — Emergency Department (HOSPITAL_COMMUNITY): Payer: Medicaid Other

## 2021-03-10 ENCOUNTER — Emergency Department (HOSPITAL_COMMUNITY)
Admission: EM | Admit: 2021-03-10 | Discharge: 2021-03-10 | Disposition: A | Discharge status: Home / Self Care | Payer: Medicaid Other | Attending: Emergency Medicine | Admitting: Emergency Medicine

## 2021-03-10 DIAGNOSIS — S39012A Strain of muscle, fascia and tendon of lower back, initial encounter: Secondary | ICD-10-CM

## 2021-03-10 DIAGNOSIS — Y92410 Unspecified street and highway as the place of occurrence of the external cause: Secondary | ICD-10-CM | POA: Insufficient documentation

## 2021-03-10 DIAGNOSIS — M545 Low back pain, unspecified: Secondary | ICD-10-CM | POA: Diagnosis present

## 2021-03-10 LAB — POC URINE PREG, ED: Preg Test, Ur: NEGATIVE

## 2021-03-10 MED ORDER — IBUPROFEN 400 MG PO TABS
400.0000 mg | ORAL_TABLET | Freq: Once | ORAL | Status: AC
  Administered 2021-03-10: 400 mg via ORAL
  Filled 2021-03-10: qty 1

## 2021-03-10 MED ORDER — ACETAMINOPHEN 500 MG PO TABS
1000.0000 mg | ORAL_TABLET | Freq: Once | ORAL | Status: AC
  Administered 2021-03-10: 1000 mg via ORAL
  Filled 2021-03-10: qty 2

## 2021-03-10 MED ORDER — METHOCARBAMOL 750 MG PO TABS: 750.0000 mg | ORAL_TABLET | Freq: Three times a day (TID) | ORAL | 0 refills | Status: DC | PRN
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
## 2021-03-10 NOTE — Telephone Encounter (Signed)
Pt called back in and page 3 was answered
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
## 2021-03-10 NOTE — Telephone Encounter (Signed)
Pt called to ask the questions on page 3 of the paperwork she left for Dr Delice Lesch to feel out no answer left a voice mail to call the office back

## 2021-03-10 NOTE — ED Notes (Signed)
Pt verbalized understanding of d/c instructions, meds and followup care. Denies questions. VSS, no distress noted. Steady gait to exit with all belongings.  ?

## 2021-03-10 NOTE — Discharge Instructions (Addendum)
It was our pleasure to provide your ER care today - we hope that you feel better.  Take acetaminophen or ibuprofen as need for pain. You may also take robaxin as need for muscle pain/spasm - no driving when taking.   Follow up with primary care doctor in one week if symptoms fail to improve/resolve.  Return to ER if worse, new symptoms, severe/intractable pain, numbness/weakness, problems w normal bowel or bladder function, high fevers, or other concern.

## 2021-03-14 ENCOUNTER — Other Ambulatory Visit: Payer: Self-pay

## 2021-03-14 ENCOUNTER — Ambulatory Visit (INDEPENDENT_AMBULATORY_CARE_PROVIDER_SITE_OTHER): Payer: Medicaid Other | Admitting: Family Medicine

## 2021-03-14 VITALS — BP 116/78 | HR 78 | Ht 68.0 in | Wt 148.6 lb

## 2021-03-14 DIAGNOSIS — H538 Other visual disturbances: Secondary | ICD-10-CM | POA: Diagnosis not present

## 2021-03-14 DIAGNOSIS — F32A Depression, unspecified: Secondary | ICD-10-CM | POA: Diagnosis not present

## 2021-03-14 DIAGNOSIS — F419 Anxiety disorder, unspecified: Secondary | ICD-10-CM | POA: Diagnosis not present

## 2021-03-14 DIAGNOSIS — M5442 Lumbago with sciatica, left side: Secondary | ICD-10-CM | POA: Diagnosis not present

## 2021-03-14 DIAGNOSIS — R29898 Other symptoms and signs involving the musculoskeletal system: Secondary | ICD-10-CM | POA: Diagnosis present

## 2021-03-14 DIAGNOSIS — M5432 Sciatica, left side: Secondary | ICD-10-CM | POA: Diagnosis not present

## 2021-03-14 MED ORDER — LIDOCAINE 4 % EX PTCH: 1.0000 | MEDICATED_PATCH | Freq: Every day | CUTANEOUS | 0 refills | Status: AC

## 2021-03-14 MED ORDER — CYCLOBENZAPRINE HCL 10 MG PO TABS: 10.0000 mg | ORAL_TABLET | Freq: Three times a day (TID) | ORAL | 0 refills | Status: DC | PRN

## 2021-03-14 MED ORDER — HYDROXYZINE HCL 50 MG PO TABS: 50.0000 mg | ORAL_TABLET | Freq: Three times a day (TID) | ORAL | 0 refills | Status: DC | PRN
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
## 2021-03-14 NOTE — Patient Instructions (Signed)
It was wonderful to meet you today. Thank you for allowing me to be a part of your care. Below is a short summary of what we discussed at your visit today:  Back pain Start using Flexeril for muscle cramping.  This medicine is to be used only as needed.  Use caution as this can make you sleepy.  Start doing the stretches on the handout that I gave you.  I have also referred you to physical therapy, they should be calling you soon to set up an appointment directly.  Try the lidocaine patches at the point in your spine but is most tender.  This can help with the pain.  I have referred you to neurology for the left leg weakness and pain.  They should be contacting you soon to make an appointment directly.  Anxious episodes I prescribed an as needed medicine called Atarax you may use it up to 3 times daily as needed for anxious episodes.  This will not cure the anxiety but it will make it more manageable.  Use caution as this can make you sleepy.  Of note both the Flexeril and the Atarax may make you sleepy.  Do not use together.  Come back for follow-up in 2 weeks with your PCP to see how you are doing.   Please bring all of your medications to every appointment!  If you have any questions or concerns, please do not hesitate to contact us via phone or MyChart message.   Ezequiel Essex, MD

## 2021-03-16 ENCOUNTER — Encounter (HOSPITAL_BASED_OUTPATIENT_CLINIC_OR_DEPARTMENT_OTHER): Payer: Self-pay

## 2021-03-16 ENCOUNTER — Emergency Department (HOSPITAL_BASED_OUTPATIENT_CLINIC_OR_DEPARTMENT_OTHER)
Admission: EM | Admit: 2021-03-16 | Discharge: 2021-03-16 | Disposition: A | Discharge status: Home / Self Care | Payer: Medicaid Other | Attending: Emergency Medicine | Admitting: Emergency Medicine

## 2021-03-16 ENCOUNTER — Other Ambulatory Visit: Payer: Self-pay

## 2021-03-16 DIAGNOSIS — H6691 Otitis media, unspecified, right ear: Secondary | ICD-10-CM | POA: Insufficient documentation

## 2021-03-16 DIAGNOSIS — Z79899 Other long term (current) drug therapy: Secondary | ICD-10-CM | POA: Diagnosis not present

## 2021-03-16 DIAGNOSIS — H9201 Otalgia, right ear: Secondary | ICD-10-CM | POA: Diagnosis present

## 2021-03-16 DIAGNOSIS — H6690 Otitis media, unspecified, unspecified ear: Secondary | ICD-10-CM

## 2021-03-16 MED ORDER — IBUPROFEN 400 MG PO TABS: 600.0000 mg | ORAL_TABLET | Freq: Once | ORAL | Status: DC

## 2021-03-16 NOTE — ED Provider Notes (Signed)
[facility] EMERGENCY DEPT Provider Note   CSN: [id number] Arrival date & time: 03/16/21  1741     History  Chief Complaint  Patient presents with   Headache    Margaret [name] is a 36 y.o. female. With past medical history of seizure disorder, GERD, migraine who presents to the emergency department with headache.   States that symptoms began this morning with right sided headache and ear pain. Additionally she has noted right sided throat pain for 2-3 hours. States that it hurts to touch right side of neck. She states she has a history of migraines and usually has symptoms on the right side. She has been eating and drinking. Denies any visual changes, numbness or tingling to the face, speech difficulty, nausea, vomiting or fevers.    Headache Associated symptoms: ear pain and sore throat   Associated symptoms: no fatigue, no fever, no nausea and no vomiting       Home Medications Prior to Admission medications   Medication Sig Start Date End Date Taking? Authorizing Provider  Cholecalciferol 50 MCG (2000 UT) CAPS Take 1 capsule (2,000 Units total) by mouth daily. 07/15/20   [name], DO  cyclobenzaprine (FLEXERIL) 10 MG tablet Take 1 tablet (10 mg total) by mouth 3 (three) times daily as needed for muscle spasms. 03/14/21   Ezequiel Essex, MD  hydrOXYzine (ATARAX) 50 MG tablet Take 1 tablet (50 mg total) by mouth 3 (three) times daily as needed for anxiety. 03/14/21   Ezequiel Essex, MD  ibuprofen (ADVIL) 600 MG tablet Take 1 tablet (600 mg total) by mouth every 8 (eight) hours as needed. 10/07/19   Lyndee Hensen, DO  lamoTRIgine (LAMICTAL) 150 MG tablet Take 1 tablet (150 mg total) by mouth 2 (two) times daily. 08/23/20   Cameron Sprang, MD  Lidocaine (HM LIDOCAINE PATCH) 4 % PTCH Apply 1 patch topically daily for 2 doses. 03/14/21 03/16/21  Ezequiel Essex, MD  MAGNESIUM PO Take by mouth.    [provider]  methocarbamol (ROBAXIN) 750 MG tablet Take 1  tablet (750 mg total) by mouth 3 (three) times daily as needed (muscle spasm/pain). 03/10/21   Lajean Saver, MD  polyethylene glycol powder (GLYCOLAX/MIRALAX) 17 GM/SCOOP powder Take 17 g by mouth 2 (two) times daily as needed. 10/07/19   Lyndee Hensen, DO      Allergies    Patient has no known allergies.    Review of Systems   Review of Systems  Constitutional:  Negative for appetite change, fatigue and fever.  HENT:  Positive for ear pain and sore throat.   Eyes:  Negative for visual disturbance.  Gastrointestinal:  Negative for nausea and vomiting.  Neurological:  Positive for headaches.  All other systems reviewed and are negative.  Physical Exam Updated Vital Signs BP 110/78 (BP Location: Right Arm)    Pulse 61    Temp 98.2 F (36.8 C)    Resp 15    Ht 5\' 8"  (1.727 m)    Wt 67.4 kg    SpO2 100%    BMI 22.59 kg/m  Physical Exam Vitals and nursing note reviewed.  Constitutional:      General: She is not in acute distress.    Appearance: She is well-developed. She is not ill-appearing or toxic-appearing.  HENT:     Head: Normocephalic and atraumatic.     Mouth/Throat:     Mouth: Mucous membranes are moist.     Pharynx: Oropharynx is clear.  Eyes:  General: No visual field deficit or scleral icterus.    Extraocular Movements: Extraocular movements intact.     Right eye: Normal extraocular motion and no nystagmus.     Left eye: Normal extraocular motion and no nystagmus.     Pupils: Pupils are equal, round, and reactive to light.  Cardiovascular:     Rate and Rhythm: Normal rate and regular rhythm.     Heart sounds: Normal heart sounds. No murmur heard. Pulmonary:     Effort: Pulmonary effort is normal. No respiratory distress.  Abdominal:     Palpations: Abdomen is soft.  Musculoskeletal:        General: Normal range of motion.     Cervical back: Normal range of motion and neck supple. No rigidity.  Lymphadenopathy:     Cervical: No cervical adenopathy.  Skin:     General: Skin is warm and dry.     Capillary Refill: Capillary refill takes less than 2 seconds.     Findings: No rash.  Neurological:     General: No focal deficit present.     Mental Status: She is alert and oriented to person, place, and time.     GCS: GCS eye subscore is 4. GCS verbal subscore is 5. GCS motor subscore is 6.     Cranial Nerves: No cranial nerve deficit, dysarthria or facial asymmetry.     Sensory: No sensory deficit.  Psychiatric:        Mood and Affect: Mood normal.        Behavior: Behavior normal.        Thought Content: Thought content normal.        Judgment: Judgment normal.    ED Results / Procedures / Treatments   Labs (all labs ordered are listed, but only abnormal results are displayed) Labs Reviewed - No data to display  EKG None  Radiology No results found.  Procedures Procedures    Medications Ordered in ED Medications - No data to display  ED Course/ Medical Decision Making/ A&P                           Medical Decision Making Prior to work up or pain medication trial, patient eloped from the emergency department. Was unable to find patient and presumed eloped prior to order labs/medication.  Overall, I had low suspicion for intracranial process. Her physical exam as described above. Only significant for mild right external canal erythematous ear. She is overall non-toxic in appearance.  Final Clinical Impression(s) / ED Diagnoses Final diagnoses:  Acute otitis media, unspecified otitis media type    Rx / DC Orders ED Discharge Orders     None         Mickie Hillier, PA-C 03/17/21 1609    Jeanell Sparrow, DO 03/17/21 2021

## 2021-03-16 NOTE — ED Triage Notes (Signed)
Patient here POV from Home with with Headache.  Patient states she began to have a Headache this AM associated with Right Ear and Right Eye Pain. Unresponsive to Tylenol at Home.  No Visual Deficits noted. No Fevers. No N/V. No Photosensitivity.   NAD Noted during Triage. A&Ox4. GCS 15. Ambulatory.

## 2021-03-20 ENCOUNTER — Telehealth: Payer: Self-pay | Admitting: Family Medicine

## 2021-03-20 NOTE — Telephone Encounter (Signed)
Patient dropped off request for info form and disability form to be completed. Last DOS was 03/14/21. Placed in [employer].

## 2021-03-20 NOTE — Telephone Encounter (Signed)
Reviewed form and placed in PCP's box for completion.  .Karrin Eisenmenger R Annabelle Rexroad, CMA  

## 2021-03-28 ENCOUNTER — Ambulatory Visit (INDEPENDENT_AMBULATORY_CARE_PROVIDER_SITE_OTHER): Payer: Medicaid Other | Admitting: Family Medicine

## 2021-03-28 ENCOUNTER — Other Ambulatory Visit: Payer: Self-pay

## 2021-03-28 ENCOUNTER — Encounter: Payer: Self-pay | Admitting: Family Medicine

## 2021-03-28 VITALS — BP 90/66 | HR 82 | Wt 151.8 lb

## 2021-03-28 DIAGNOSIS — M5442 Lumbago with sciatica, left side: Secondary | ICD-10-CM | POA: Diagnosis not present

## 2021-03-28 DIAGNOSIS — F419 Anxiety disorder, unspecified: Secondary | ICD-10-CM | POA: Diagnosis not present

## 2021-03-28 DIAGNOSIS — F32A Depression, unspecified: Secondary | ICD-10-CM | POA: Diagnosis not present

## 2021-03-28 MED ORDER — SERTRALINE HCL 25 MG PO TABS: 25.0000 mg | ORAL_TABLET | Freq: Every day | ORAL | 0 refills | Status: DC
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
## 2021-04-07 ENCOUNTER — Encounter: Payer: Self-pay | Admitting: Family Medicine

## 2021-04-07 NOTE — Progress Notes (Signed)
Patient has no-showed to multiple appointments in a 6-month period. Per our no-show policy, a letter has been routed to [employer] to be mailed to patient regarding likely dismissal for repeat no show. Will CC to PCP.  ° °

## 2021-04-12 ENCOUNTER — Ambulatory Visit (INDEPENDENT_AMBULATORY_CARE_PROVIDER_SITE_OTHER): Payer: Medicaid Other | Admitting: Family Medicine

## 2021-04-12 ENCOUNTER — Other Ambulatory Visit: Payer: Self-pay

## 2021-04-12 ENCOUNTER — Encounter: Payer: Self-pay | Admitting: Family Medicine

## 2021-04-12 VITALS — BP 110/80 | HR 81 | Ht 68.0 in | Wt 152.8 lb

## 2021-04-12 DIAGNOSIS — F32A Depression, unspecified: Secondary | ICD-10-CM | POA: Diagnosis not present

## 2021-04-12 DIAGNOSIS — R29898 Other symptoms and signs involving the musculoskeletal system: Secondary | ICD-10-CM

## 2021-04-12 DIAGNOSIS — F419 Anxiety disorder, unspecified: Secondary | ICD-10-CM | POA: Diagnosis not present
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
## 2021-04-13 ENCOUNTER — Ambulatory Visit (INDEPENDENT_AMBULATORY_CARE_PROVIDER_SITE_OTHER): Payer: Medicaid Other | Admitting: Nurse Practitioner

## 2021-04-13 ENCOUNTER — Encounter (HOSPITAL_BASED_OUTPATIENT_CLINIC_OR_DEPARTMENT_OTHER): Payer: Self-pay | Admitting: Nurse Practitioner

## 2021-04-13 ENCOUNTER — Other Ambulatory Visit (HOSPITAL_BASED_OUTPATIENT_CLINIC_OR_DEPARTMENT_OTHER): Payer: Self-pay

## 2021-04-13 DIAGNOSIS — Z136 Encounter for screening for cardiovascular disorders: Secondary | ICD-10-CM

## 2021-04-13 DIAGNOSIS — M5432 Sciatica, left side: Secondary | ICD-10-CM | POA: Insufficient documentation

## 2021-04-13 DIAGNOSIS — G40309 Generalized idiopathic epilepsy and epileptic syndromes, not intractable, without status epilepticus: Secondary | ICD-10-CM | POA: Insufficient documentation

## 2021-04-13 DIAGNOSIS — D509 Iron deficiency anemia, unspecified: Secondary | ICD-10-CM | POA: Diagnosis not present

## 2021-04-13 DIAGNOSIS — F32A Depression, unspecified: Secondary | ICD-10-CM

## 2021-04-13 DIAGNOSIS — R29898 Other symptoms and signs involving the musculoskeletal system: Secondary | ICD-10-CM

## 2021-04-13 DIAGNOSIS — F419 Anxiety disorder, unspecified: Secondary | ICD-10-CM

## 2021-04-13 DIAGNOSIS — Z8719 Personal history of other diseases of the digestive system: Secondary | ICD-10-CM

## 2021-04-13 DIAGNOSIS — E038 Other specified hypothyroidism: Secondary | ICD-10-CM

## 2021-04-13 DIAGNOSIS — Z131 Encounter for screening for diabetes mellitus: Secondary | ICD-10-CM

## 2021-04-13 DIAGNOSIS — G40909 Epilepsy, unspecified, not intractable, without status epilepticus: Secondary | ICD-10-CM

## 2021-04-13 DIAGNOSIS — E559 Vitamin D deficiency, unspecified: Secondary | ICD-10-CM

## 2021-04-13 DIAGNOSIS — Z13220 Encounter for screening for lipoid disorders: Secondary | ICD-10-CM

## 2021-04-13 MED ORDER — SERTRALINE HCL 25 MG PO TABS
25.0000 mg | ORAL_TABLET | Freq: Every day | ORAL | 0 refills | Status: DC
  Filled 2021-04-13: qty 30, 30d supply, fill #0
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
## 2021-04-13 NOTE — Assessment & Plan Note (Signed)
Present 04/2019. Will repeat labs today for evaluation.  ?
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
## 2021-04-13 NOTE — Assessment & Plan Note (Addendum)
Resolved. No current signs or symptoms. Will obtain labs today.  ?
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
## 2021-04-13 NOTE — Patient Instructions (Addendum)
Thank you for choosing [facility] for your Primary Care needs. I am excited for the opportunity to partner with you to meet your health care goals. It was a pleasure meeting you today!  Recommendations from today's visit: I will work on your paperwork and have that ready for you by Monday. We will call you to come pick it up. I will let you know if there is anything concerning with your labs I have sent the referral for PT and for counseling I sent the sertraline downstairs to the pharmacy for you  Information on diet, exercise, and health maintenance recommendations are listed below. This is information to help you be sure you are on track for optimal health and monitoring.   Please look over this and let us know if you have any questions or if you have completed any of the health maintenance outside of [place] so that we can be sure your records are up to date.  ___________________________________________________________ About Me: I am an Adult-Geriatric Nurse Practitioner with a background in caring for patients for more than 20 years with a strong intensive care background. I provide primary care and sports medicine services to patients age 25 and older within this office. My education had a strong focus on caring for the older adult population, which I am passionate about. I am also the director of the APP Fellowship with [employer]   My desire is to provide you with the best service through preventive medicine and supportive care. I consider you a part of the medical team and value your input. I work diligently to ensure that you are heard and your needs are met in a safe and effective manner. I want you to feel comfortable with me as your provider and want you to know that your health concerns are important to me.  For your information, our office hours are: Monday, Tuesday, and Thursday 8:00 AM - 5:00 PM Wednesday and Friday 8:00 AM - 12:00 PM.    In my time away from the office I am teaching new APP's within the system and am unavailable, but my partner, Dr. Burnard Bunting is in the office for emergent needs.   If you have questions or concerns, please call our office at [phone] or send [place] a MyChart message and we will respond as quickly as possible.  ____________________________________________________________ MyChart:  For all urgent or time sensitive needs we ask that you please call the office to avoid delays. Our number is [phone]. MyChart is not constantly monitored and due to the large volume of messages a day, replies may take up to 72 business hours.  MyChart Policy: MyChart allows for you to see your visit notes, after visit summary, provider recommendations, lab and tests results, make an appointment, request refills, and contact your provider or the office for non-urgent questions or concerns. Providers are seeing patients during normal business hours and do not have built in time to review MyChart messages.  We ask that you allow a minimum of 3 business days for responses to [employer]. For this reason, please do not send urgent requests through [place]. Please call the office at [phone]. New and ongoing conditions may require a visit. We have virtual and in person visit available for your convenience.  Complex MyChart concerns may require a visit. Your provider may request you schedule a virtual or in person visit to ensure we are providing the best care possible. MyChart messages sent after 11:00 AM  on Friday will not be received by the provider until Monday morning.    Lab and Test Results: You will receive your lab and test results on MyChart as soon as they are completed and results have been sent by the lab or testing facility. Due to this service, you will receive your results BEFORE your provider.  I review lab and tests results each morning prior to seeing patients. Some results require collaboration  with other providers to ensure you are receiving the most appropriate care. For this reason, we ask that you please allow a minimum of 3-5 business days from the time the ALL results have been received for your provider to receive and review lab and test results and contact you about these.  Most lab and test result comments from the provider will be sent through [place]. Your provider may recommend changes to the plan of care, follow-up visits, repeat testing, ask questions, or request an office visit to discuss these results. You may reply directly to this message or call the office at [phone] to provide information for the provider or set up an appointment. In some instances, you will be called with test results and recommendations. Please let us know if this is preferred and we will make note of this in your chart to provide this for you.    If you have not heard a response to your lab or test results in 5 business days from all results returning to [place], please call the office to let us know. We ask that you please avoid calling prior to this time unless there is an emergent concern. Due to high call volumes, this can delay the resulting process.  After Hours: For all non-emergency after hours needs, please call the office at [phone] and select the option to reach the on-call provider service. On-call services are shared between multiple [place] offices and therefore it will not be possible to speak directly with your provider. On-call providers may provide medical advice and recommendations, but are unable to provide refills for maintenance medications.  For all emergency or urgent medical needs after normal business hours, we recommend that you seek care at the closest Urgent Care or Emergency Department to ensure appropriate treatment in a timely manner.  [facility] [place] has a 24 hour emergency room located on the ground floor for your convenience.   Urgent  Concerns During the Business Day Providers are seeing patients from 8AM to [place] with a busy schedule and are most often not able to respond to non-urgent calls until the end of the day or the next business day. If you should have URGENT concerns during the day, please call and speak to the nurse or schedule a same day appointment so that we can address your concern without delay.   Thank you, again, for choosing me as your health care partner. I appreciate your trust and look forward to learning more about you.   Worthy Keeler, DNP, AGNP-c ___________________________________________________________  Health Maintenance Recommendations Screening Testing Mammogram Every 1 -2 years based on history and risk factors Starting at age 29 Pap Smear Ages 21-39 every 3 years Ages 69-65 every 5 years with HPV testing More frequent testing may be required based on results and history Colon Cancer Screening Every 1-10 years based on test performed, risk factors, and history Starting at age 29 Bone Density Screening Every 2-10 years based on history Starting at age 57 for women Recommendations for men differ based on medication usage,  history, and risk factors AAA Screening One time ultrasound Men 26-68 years old who have every smoked Lung Cancer Screening Low Dose Lung CT every 12 months Age 80-80 years with a 30 pack-year smoking history who still smoke or who have quit within the last 15 years  Screening Labs Routine  Labs: Complete Blood Count (CBC), Complete Metabolic Panel (CMP), Cholesterol (Lipid Panel) Every 6-12 months based on history and medications May be recommended more frequently based on current conditions or previous results Hemoglobin A1c Lab Every 3-12 months based on history and previous results Starting at age 38 or earlier with diagnosis of diabetes, high cholesterol, BMI >26, and/or risk factors Frequent monitoring for patients with diabetes to ensure blood sugar  control Thyroid Panel (TSH w/ T3 & T4) Every 6 months based on history, symptoms, and risk factors May be repeated more often if on medication HIV One time testing for all patients 68 and older May be repeated more frequently for patients with increased risk factors or exposure Hepatitis C One time testing for all patients 48 and older May be repeated more frequently for patients with increased risk factors or exposure Gonorrhea, Chlamydia Every 12 months for all sexually active persons 13-24 years Additional monitoring may be recommended for those who are considered high risk or who have symptoms PSA Men 58-49 years old with risk factors Additional screening may be recommended from age 47-69 based on risk factors, symptoms, and history  Vaccine Recommendations Tetanus Booster All adults every 10 years Flu Vaccine All patients 6 months and older every year COVID Vaccine All patients 12 years and older Initial dosing with booster May recommend additional booster based on age and health history HPV Vaccine 2 doses all patients age 38-26 Dosing may be considered for patients over 26 Shingles Vaccine (Shingrix) 2 doses all adults 43 years and older Pneumonia (Pneumovax 23) All adults 69 years and older May recommend earlier dosing based on health history Pneumonia (Prevnar 59) All adults 34 years and older Dosed 1 year after Pneumovax 23  Additional Screening, Testing, and Vaccinations may be recommended on an individualized basis based on family history, health history, risk factors, and/or exposure.  __________________________________________________________  Diet Recommendations for All Patients  I recommend that all patients maintain a diet low in saturated fats, carbohydrates, and cholesterol. While this can be challenging at first, it is not impossible and small changes can make big differences.  Things to try: Decreasing the amount of soda, sweet tea, and/or juice to  one or less per day and replace with water While water is always the first choice, if you do not like water you may consider adding a water additive without sugar to improve the taste other sugar free drinks Replace potatoes with a brightly colored vegetable at dinner Use healthy oils, such as canola oil or olive oil, instead of butter or hard margarine Limit your bread intake to two pieces or less a day Replace regular pasta with low carb pasta options Bake, broil, or grill foods instead of frying Monitor portion sizes  Eat smaller, more frequent meals throughout the day instead of large meals  An important thing to remember is, if you love foods that are not great for your health, you don't have to give them up completely. Instead, allow these foods to be a reward when you have done well. Allowing yourself to still have special treats every once in a while is a nice way to tell yourself thank you for working hard  to keep yourself healthy.   Also remember that every day is a new day. If you have a bad day and "fall off the wagon", you can still climb right back up and keep moving along on your journey!  We have resources available to help you!  Some websites that may be helpful include: www.http://carter.biz/  Www.[employer]Well[employer].com _____________________________________________________________  Activity Recommendations for All Patients  I recommend that all adults get at least 20 minutes of moderate physical activity that elevates your heart rate at least 5 days out of the week.  Some examples include: Walking or jogging at a pace that allows you to carry on a conversation Cycling (stationary bike or outdoors) Water aerobics Yoga Weight lifting Dancing If physical limitations prevent you from putting stress on your joints, exercise in a pool or seated in a chair are excellent options.  Do determine your MAXIMUM heart rate for activity: YOUR AGE - 220 = MAX HeartRate   Remember! Do not  push yourself too hard.  Start slowly and build up your pace, speed, weight, time in exercise, etc.  Allow your body to rest between exercise and get good sleep. You will need more water than normal when you are exerting yourself. Do not wait until you are thirsty to drink. Drink with a purpose of getting in at least 8, 8 ounce glasses of water a day plus more depending on how much you exercise and sweat.    If you begin to develop dizziness, chest pain, abdominal pain, jaw pain, shortness of breath, headache, vision changes, lightheadedness, or other concerning symptoms, stop the activity and allow your body to rest. If your symptoms are severe, seek emergency evaluation immediately. If your symptoms are concerning, but not severe, please let us know so that we can recommend further evaluation.

## 2021-04-13 NOTE — Assessment & Plan Note (Signed)
Historical. Will obtain labs today.  ?

## 2021-04-13 NOTE — Assessment & Plan Note (Signed)
Chronic. Currently managed by neurology. No alarm symptoms present today.  ?Refills on file through 08/2021.  ?Will monitor labs today.  ?Continue to collaborate with neurology.  ?Patient will notify us or neurology if any new or worsening symptoms present.  ?
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
## 2021-04-14 LAB — CBC WITH DIFFERENTIAL/PLATELET
Basophils Absolute: 0.1 10*3/uL (ref 0.0–0.2)
Basos: 1 %
EOS (ABSOLUTE): 0.2 10*3/uL (ref 0.0–0.4)
Eos: 3 %
Hematocrit: 39.7 % (ref 34.0–46.6)
Hemoglobin: 13.3 g/dL (ref 11.1–15.9)
Immature Grans (Abs): 0 10*3/uL (ref 0.0–0.1)
Immature Granulocytes: 0 %
Lymphocytes Absolute: 2 10*3/uL (ref 0.7–3.1)
Lymphs: 36 %
MCH: 29.7 pg (ref 26.6–33.0)
MCHC: 33.5 g/dL (ref 31.5–35.7)
MCV: 89 fL (ref 79–97)
Monocytes Absolute: 0.4 10*3/uL (ref 0.1–0.9)
Monocytes: 7 %
Neutrophils Absolute: 3 10*3/uL (ref 1.4–7.0)
Neutrophils: 53 %
Platelets: 257 10*3/uL (ref 150–450)
RBC: 4.48 x10E6/uL (ref 3.77–5.28)
RDW: 13.3 % (ref 11.7–15.4)
WBC: 5.6 10*3/uL (ref 3.4–10.8)

## 2021-04-14 LAB — T3: T3, Total: 72 ng/dL (ref 71–180)

## 2021-04-14 LAB — LIPID PANEL
Chol/HDL Ratio: 2.9 ratio (ref 0.0–4.4)
Cholesterol, Total: 196 mg/dL (ref 100–199)
HDL: 67 mg/dL (ref >39)
LDL Chol Calc (NIH): 107 mg/dL — ABNORMAL HIGH (ref 0–99)
Triglycerides: 123 mg/dL (ref 0–149)
VLDL Cholesterol Cal: 22 mg/dL (ref 5–40)

## 2021-04-14 LAB — COMPREHENSIVE METABOLIC PANEL
ALT: 21 IU/L (ref 0–32)
AST: 18 IU/L (ref 0–40)
Albumin/Globulin Ratio: 1.9 (ref 1.2–2.2)
Albumin: 4.7 g/dL (ref 3.8–4.8)
Alkaline Phosphatase: 50 IU/L (ref 44–121)
BUN/Creatinine Ratio: 16 (ref 9–23)
BUN: 10 mg/dL (ref 6–20)
Bilirubin Total: 0.2 mg/dL (ref 0.0–1.2)
CO2: 21 mmol/L (ref 20–29)
Calcium: 9.5 mg/dL (ref 8.7–10.2)
Chloride: 104 mmol/L (ref 96–106)
Creatinine, Ser: 0.64 mg/dL (ref 0.57–1.00)
Globulin, Total: 2.5 g/dL (ref 1.5–4.5)
Glucose: 80 mg/dL (ref 70–99)
Potassium: 4.5 mmol/L (ref 3.5–5.2)
Sodium: 141 mmol/L (ref 134–144)
Total Protein: 7.2 g/dL (ref 6.0–8.5)
eGFR: 116 mL/min/{1.73_m2} (ref >59)

## 2021-04-14 LAB — IRON,TIBC AND FERRITIN PANEL
Ferritin: 11 ng/mL — ABNORMAL LOW (ref 15–150)
Iron Saturation: 33 % (ref 15–55)
Iron: 141 ug/dL (ref 27–159)
Total Iron Binding Capacity: 429 ug/dL (ref 250–450)
UIBC: 288 ug/dL (ref 131–425)

## 2021-04-14 LAB — HEMOGLOBIN A1C
Est. average glucose Bld gHb Est-mCnc: 111 mg/dL
Hgb A1c MFr Bld: 5.5 % (ref 4.8–5.6)

## 2021-04-14 LAB — TSH: TSH: 4.32 u[IU]/mL (ref 0.450–4.500)

## 2021-04-14 LAB — T4: T4, Total: 7.1 ug/dL (ref 4.5–12.0)

## 2021-04-14 LAB — VITAMIN D 25 HYDROXY (VIT D DEFICIENCY, FRACTURES): Vit D, 25-Hydroxy: 23.3 ng/mL — ABNORMAL LOW (ref 30.0–100.0)

## 2021-04-17 ENCOUNTER — Emergency Department (HOSPITAL_BASED_OUTPATIENT_CLINIC_OR_DEPARTMENT_OTHER): Payer: Medicaid Other | Admitting: Radiology

## 2021-04-17 ENCOUNTER — Encounter (HOSPITAL_BASED_OUTPATIENT_CLINIC_OR_DEPARTMENT_OTHER): Payer: Self-pay

## 2021-04-17 ENCOUNTER — Emergency Department (HOSPITAL_BASED_OUTPATIENT_CLINIC_OR_DEPARTMENT_OTHER)
Admission: EM | Admit: 2021-04-17 | Discharge: 2021-04-17 | Disposition: A | Discharge status: Home / Self Care | Payer: Medicaid Other | Attending: Emergency Medicine | Admitting: Emergency Medicine

## 2021-04-17 ENCOUNTER — Other Ambulatory Visit: Payer: Self-pay

## 2021-04-17 DIAGNOSIS — R519 Headache, unspecified: Secondary | ICD-10-CM

## 2021-04-17 DIAGNOSIS — Z20822 Contact with and (suspected) exposure to covid-19: Secondary | ICD-10-CM | POA: Insufficient documentation

## 2021-04-17 DIAGNOSIS — R079 Chest pain, unspecified: Secondary | ICD-10-CM | POA: Diagnosis not present

## 2021-04-17 DIAGNOSIS — M542 Cervicalgia: Secondary | ICD-10-CM | POA: Diagnosis not present

## 2021-04-17 DIAGNOSIS — R059 Cough, unspecified: Secondary | ICD-10-CM | POA: Diagnosis present

## 2021-04-17 DIAGNOSIS — J069 Acute upper respiratory infection, unspecified: Secondary | ICD-10-CM | POA: Diagnosis not present

## 2021-04-17 LAB — BASIC METABOLIC PANEL
Anion gap: 7 (ref 5–15)
BUN: 7 mg/dL (ref 6–20)
CO2: 26 mmol/L (ref 22–32)
Calcium: 9.3 mg/dL (ref 8.9–10.3)
Chloride: 104 mmol/L (ref 98–111)
Creatinine, Ser: 0.55 mg/dL (ref 0.44–1.00)
GFR, Estimated: >60 mL/min (ref >60)
Glucose, Bld: 100 mg/dL — ABNORMAL HIGH (ref 70–99)
Potassium: 4.5 mmol/L (ref 3.5–5.1)
Sodium: 137 mmol/L (ref 135–145)

## 2021-04-17 LAB — CBC
HCT: 35.2 % — ABNORMAL LOW (ref 36.0–46.0)
Hemoglobin: 11.5 g/dL — ABNORMAL LOW (ref 12.0–15.0)
MCH: 28.6 pg (ref 26.0–34.0)
MCHC: 32.7 g/dL (ref 30.0–36.0)
MCV: 87.6 fL (ref 80.0–100.0)
Platelets: 240 10*3/uL (ref 150–400)
RBC: 4.02 MIL/uL (ref 3.87–5.11)
RDW: 13.1 % (ref 11.5–15.5)
WBC: 6.2 10*3/uL (ref 4.0–10.5)
nRBC: 0 % (ref 0.0–0.2)

## 2021-04-17 LAB — RESP PANEL BY RT-PCR (FLU A&B, COVID) ARPGX2
Influenza A by PCR: NEGATIVE
Influenza B by PCR: NEGATIVE
SARS Coronavirus 2 by RT PCR: NEGATIVE

## 2021-04-17 LAB — TROPONIN I (HIGH SENSITIVITY): Troponin I (High Sensitivity): <2 ng/L (ref <18)

## 2021-04-17 LAB — PREGNANCY, URINE: Preg Test, Ur: NEGATIVE

## 2021-04-17 MED ORDER — ALUM & MAG HYDROXIDE-SIMETH 200-200-20 MG/5ML PO SUSP
30.0000 mL | Freq: Once | ORAL | Status: AC
  Administered 2021-04-17: 30 mL via ORAL
  Filled 2021-04-17: qty 30

## 2021-04-17 MED ORDER — OMEPRAZOLE 20 MG PO CPDR: 20.0000 mg | DELAYED_RELEASE_CAPSULE | Freq: Every day | ORAL | 0 refills | Status: DC

## 2021-04-17 MED ORDER — FAMOTIDINE IN NACL 20-0.9 MG/50ML-% IV SOLN
20.0000 mg | Freq: Once | INTRAVENOUS | Status: AC
  Administered 2021-04-17: 20 mg via INTRAVENOUS
  Filled 2021-04-17: qty 50

## 2021-04-17 MED ORDER — ALUM & MAG HYDROXIDE-SIMETH 200-200-20 MG/5ML PO SUSP: 15.0000 mL | Freq: Four times a day (QID) | ORAL | 0 refills | Status: DC | PRN

## 2021-04-17 MED ORDER — KETOROLAC TROMETHAMINE 15 MG/ML IJ SOLN
15.0000 mg | Freq: Once | INTRAMUSCULAR | Status: DC
Start: 2021-04-17 — End: 2021-04-17

## 2021-04-17 MED ORDER — SODIUM CHLORIDE 0.9 % IV BOLUS
1000.0000 mL | Freq: Once | INTRAVENOUS | Status: AC
  Administered 2021-04-17: 1000 mL via INTRAVENOUS

## 2021-04-17 MED ORDER — PROCHLORPERAZINE EDISYLATE 10 MG/2ML IJ SOLN
10.0000 mg | Freq: Once | INTRAMUSCULAR | Status: AC
  Administered 2021-04-17: 10 mg via INTRAVENOUS
  Filled 2021-04-17: qty 2

## 2021-04-17 MED ORDER — LIDOCAINE VISCOUS HCL 2 % MT SOLN
15.0000 mL | Freq: Once | OROMUCOSAL | Status: AC
  Administered 2021-04-17: 15 mL via ORAL
  Filled 2021-04-17: qty 15

## 2021-04-17 MED ORDER — DIPHENHYDRAMINE HCL 50 MG/ML IJ SOLN
25.0000 mg | Freq: Once | INTRAMUSCULAR | Status: AC
Start: 2021-04-17 — End: 2021-04-17
  Administered 2021-04-17: 25 mg via INTRAVENOUS
  Filled 2021-04-17: qty 1

## 2021-04-17 NOTE — ED Notes (Signed)
Instructed to hold Toradol until Preg test came back by provider... Waiting on Pt to Urinate, provider informed.  ?

## 2021-04-17 NOTE — Discharge Instructions (Addendum)
It was a pleasure taking care of you today! ? ?Your labs, imaging, EKG were unremarkable today.  You will be sent a prescription for Prilosec and Maalox, take as directed.  Ensure to maintain fluid intake.  You may take over-the-counter 600 mg ibuprofen every 6 hours or 500 mg Tylenol every 6 hours as needed for pain for no more than 7 days.  You may call your primary care provider to set up a follow-up appointment regarding today's ED visit.  Return to the emergency department for experiencing increasing/worsening headache, vision changes, fever, chest pain, trouble breathing, worsening symptoms. ?

## 2021-04-17 NOTE — ED Triage Notes (Signed)
Patient here POV from Home with Cough. ? ?Patient states she has been having a Dry Cough and Burning Sensation in Chest for approximately 3-4 days. Worsening since it began.  ? ?Pain is to Mid-Chest and radiates to Throat.  ? ?No Congestion, No Known Fevers. ? ?NAD Noted during Triage. A&Ox4. GCS 15. Ambulatory.  ?

## 2021-04-17 NOTE — ED Provider Notes (Signed)
[facility] Provider Note   CSN: [id number] Arrival date & time: 04/17/21  1256     History  Chief Complaint  Patient presents with   Cough    Margaret [name] is a 36 y.o. female who presents to the ED complaining of cough onset 3-4 days. Denies sick contacts. Has associated headache, left ear pain, neck pain, painful swallowing, and phonophobia. Her headache is worse with laying down. She also notes a burning sensation to her chest. Has tried OTC pepcid and OTC cough medications without relief. Denies vision changes, trouble swallowing, photophobia. Has a history of similar migraines that typically occur around her menstrual cycle. Her LMP started on 04/05/21 and ended on 04/11/21. Denies history of GERD. Denies PMHx of MI, cardiac catherization, stents, OCP, HRT, DVT/PE, recent immobilzation/surgeries. Pt notes that she went to urgent care yesterday due to similar concerns and was treated with medication.   Per pt chart review: Pt was evaluated at Urgent Care on 04/16/21 for similar concerns at that time, pt was given IM 60 mg toradol with relief of her symptoms.    The history is provided by the patient. No language interpreter was used.      Home Medications Prior to Admission medications   Medication Sig Start Date End Date Taking? Authorizing Provider  alum & mag hydroxide-simeth (MAALOX/MYLANTA) 200-200-20 MG/5ML suspension Take 15 mLs by mouth every 6 (six) hours as needed for indigestion or heartburn. 04/17/21  Yes Paloma Grange A, PA-C  omeprazole (PRILOSEC) 20 MG capsule Take 1 capsule (20 mg total) by mouth daily. 04/17/21 05/17/21 Yes Drey Shaff A, PA-C  Cholecalciferol 50 MCG (2000 UT) CAPS Take 1 capsule (2,000 Units total) by mouth daily. 07/15/20   [name], DO  cyclobenzaprine (FLEXERIL) 10 MG tablet Take 1 tablet (10 mg total) by mouth 3 (three) times daily as needed for muscle spasms. 03/14/21   Ezequiel Essex, MD  hydrOXYzine (ATARAX) 50 MG  tablet Take 1 tablet (50 mg total) by mouth 3 (three) times daily as needed for anxiety. 03/14/21   Ezequiel Essex, MD  ibuprofen (ADVIL) 600 MG tablet Take 1 tablet (600 mg total) by mouth every 8 (eight) hours as needed. 10/07/19   Lyndee Hensen, DO  lamoTRIgine (LAMICTAL) 150 MG tablet Take 1 tablet (150 mg total) by mouth 2 (two) times daily. 08/23/20   Cameron Sprang, MD  MAGNESIUM PO Take by mouth.    [provider]  methocarbamol (ROBAXIN) 750 MG tablet Take 1 tablet (750 mg total) by mouth 3 (three) times daily as needed (muscle spasm/pain). 03/10/21   Lajean Saver, MD  polyethylene glycol powder (GLYCOLAX/MIRALAX) 17 GM/SCOOP powder Take 17 g by mouth 2 (two) times daily as needed. 10/07/19   Lyndee Hensen, DO  sertraline (ZOLOFT) 25 MG tablet Take 1 tablet (25 mg total) by mouth daily. 04/13/21   Orma Render, NP      Allergies    Patient has no known allergies.    Review of Systems   Review of Systems  Constitutional:  Negative for chills and fever.  HENT:  Positive for ear pain. Negative for trouble swallowing.   Eyes:  Negative for photophobia and visual disturbance.  Respiratory:  Positive for cough.   Cardiovascular:  Positive for chest pain.  Neurological:  Positive for headaches.  All other systems reviewed and are negative.  Physical Exam Updated Vital Signs BP 121/89    Pulse 72    Temp 98.7 F (37.1 C) (  Oral)    Resp 14    Ht 5' (1.524 m)    Wt 68.5 kg    LMP 04/05/2021 (Exact Date)    SpO2 100%    BMI 29.49 kg/m  Physical Exam Vitals and nursing note reviewed.  Constitutional:      General: She is not in acute distress.    Appearance: She is not diaphoretic.  HENT:     Head: Normocephalic and atraumatic.     Right Ear: Tympanic membrane, ear canal and external ear normal.     Left Ear: Tympanic membrane, ear canal and external ear normal.     Nose: Nose normal.     Mouth/Throat:     Mouth: Mucous membranes are moist.     Pharynx: Oropharynx is  clear. No oropharyngeal exudate or posterior oropharyngeal erythema.  Eyes:     General: No scleral icterus.    Extraocular Movements: Extraocular movements intact.     Conjunctiva/sclera: Conjunctivae normal.     Pupils: Pupils are equal, round, and reactive to light.  Cardiovascular:     Rate and Rhythm: Normal rate and regular rhythm.     Pulses: Normal pulses.     Heart sounds: Normal heart sounds.  Pulmonary:     Effort: Pulmonary effort is normal. No respiratory distress.     Breath sounds: Normal breath sounds. No wheezing.  Chest:     Chest wall: No tenderness.  Abdominal:     General: Bowel sounds are normal.     Palpations: Abdomen is soft. There is no mass.     Tenderness: There is no abdominal tenderness. There is no guarding or rebound.  Musculoskeletal:        General: Normal range of motion.     Cervical back: Normal range of motion and neck supple.     Comments: Strength and sensation intact to bilateral upper and lower extremities.  Skin:    General: Skin is warm and dry.  Neurological:     General: No focal deficit present.     Mental Status: She is alert.     Cranial Nerves: No cranial nerve deficit.     Gait: Gait normal.  Psychiatric:        Behavior: Behavior normal.    ED Results / Procedures / Treatments   Labs (all labs ordered are listed, but only abnormal results are displayed) Labs Reviewed  BASIC METABOLIC PANEL - Abnormal; Notable for the following components:      Result Value   Glucose, Bld 100 (*)    All other components within normal limits  CBC - Abnormal; Notable for the following components:   Hemoglobin 11.5 (*)    HCT 35.2 (*)    All other components within normal limits  RESP PANEL BY RT-PCR (FLU A&B, COVID) ARPGX2  PREGNANCY, URINE  TROPONIN I (HIGH SENSITIVITY)    EKG EKG Interpretation  Date/Time:  Monday April 17 2021 13:29:18 EST Ventricular Rate:  72 PR Interval:  154 QRS Duration: 70 QT Interval:  396 QTC  Calculation: 433 R Axis:   71 Text Interpretation: Normal sinus rhythm Normal ECG When compared with ECG of 21-Jun-2020 17:59, No significant change since prior 5/22 Confirmed by Aletta Edouard [phone]) on 04/17/2021 1:33:54 PM  Radiology DG Chest 2 View  Result Date: 04/17/2021 CLINICAL DATA:  Cough, chest pain EXAM: CHEST - 2 VIEW COMPARISON:  None. FINDINGS: The heart size and mediastinal contours are within normal limits. Both lungs are clear. The visualized skeletal  structures are unremarkable. IMPRESSION: No active cardiopulmonary disease. Electronically Signed   By: Elmer Picker M.D.   On: 04/17/2021 13:45    Procedures Procedures    Medications Ordered in ED Medications  alum & mag hydroxide-simeth (MAALOX/MYLANTA) 200-200-20 MG/5ML suspension 30 mL (30 mLs Oral Given 04/17/21 1558)    And  lidocaine (XYLOCAINE) 2 % viscous mouth solution 15 mL (15 mLs Oral Given 04/17/21 1558)  famotidine (PEPCID) IVPB 20 mg premix (0 mg Intravenous Stopped 04/17/21 1743)  sodium chloride 0.9 % bolus 1,000 mL (0 mLs Intravenous Stopped 04/17/21 1743)  diphenhydrAMINE (BENADRYL) injection 25 mg (25 mg Intravenous Given 04/17/21 1604)  prochlorperazine (COMPAZINE) injection 10 mg (10 mg Intravenous Given 04/17/21 1607)    ED Course/ Medical Decision Making/ A&P Clinical Course as of 04/19/21 0011  Mon Apr 17, 2021  1532 Hemoglobin(!): 11.5 Decreased from 13.3 4 days ago. Pt without hemoptysis, blood in stool. No increase in NSAID use, no ETOH use. No prior history of GIB.  Patient menstrual cycle was on 2/22-2/28. [SB]  8588 Went to reevaluate patient and patient noted to be walking in the hallway.  Patient noted improvement of her burning sensation and headache with medication given in the ED.  Patient notes that she is ready to go at this time, discussed lab and imaging findings with patient at bedside.  Discussed discharge treatment plan, patient agreeable at this time.  Patient appears safe for  discharge.  [SB]    Clinical Course User Index [SB] Daejon Lich A, PA-C                           Medical Decision Making Amount and/or Complexity of Data Reviewed Labs: ordered. Decision-making details documented in ED Course. Radiology: ordered.  Risk OTC drugs. Prescription drug management.   Pt presented to the ED with cough onset 3-4 days. Denies sick contacts. Vital signs stable, pt afebrile. Has associated headache. No vision changes or photophobia. No focal neuro deficits on exam. On exam, pt without acute cardiovascular, respiratory, or abdominal exam findings. Differential diagnosis includes, intracranial abnormality, migraine, COVID, flu, ACS, GERD.    Additional history obtained:  External records from outside source obtained and reviewed including: Pt was evaluated at Urgent Care on 04/16/21 for similar concerns at that time, pt was given IM 60 mg toradol with relief of her symptoms.    Labs:  I ordered, and personally interpreted labs.  The pertinent results include:   Negative pregnancy urine BMP with slightly elevated glucose at 100 otherwise unremarkable. CBC without leukocytosis, hemoglobin at 11.5 decreased from 13.3 on previous value. (Pt recent menstrual cycle, no increased bleeding per patient. No hemoptysis, blood in stool, or blood in vomit. No NSAID use or ETOH use.) COVID and flu swab negative Initial troponin <2   Imaging: I ordered imaging studies including CXR  I independently visualized and interpreted imaging which showed: No acute cardiopulmonary findings I agree with the radiologist interpretation  Medications:  I ordered medication including Pepcid,  IVF, compazine, GI cocktail, benadryl for symptom management  Reevaluation of the patient after these medicines and interventions, I reevaluated the patient and found that they have improved I have reviewed the patients home medicines and have made adjustments as needed   Disposition: Pt  presentation suspicious for migraine headache with viral URI and cough. Presentation less likely due to [facility] or [facility] due to the absence of red flags. Doubt COVID, flu, pneumonia at this  time. Doubt ACS, no cardiac history and initial troponin negative. Pt with improvement of symptoms with GI cocktail. After consideration of the diagnostic results and the patients response to treatment, I feel that the patient would benefit from Discharge home. Will send prescription for Prilosec and maalox.  Discussed with patient they may follow-up with their primary care provider as needed.  Supportive care measures and strict return precautions discussed with patient.  Patient knowledges and verbalized understanding.  Patient agreeable to discharge treatment plan. Patient appears safe for discharge at this time.  Follow-up as indicated in the discharge paperwork.   This chart was dictated using voice recognition software, Dragon. Despite the best efforts of this provider to proofread and correct errors, errors may still occur which can change documentation meaning.  Final Clinical Impression(s) / ED Diagnoses Final diagnoses:  Acute nonintractable headache, unspecified headache type  Viral URI with cough    Rx / DC Orders ED Discharge Orders          Ordered    omeprazole (PRILOSEC) 20 MG capsule  Daily        04/17/21 1735    alum & mag hydroxide-simeth (MAALOX/MYLANTA) 200-200-20 MG/5ML suspension  Every 6 hours PRN        04/17/21 1736              Ramonica Grigg A, PA-C 04/19/21 0021    Regan Lemming, MD 04/19/21 1912

## 2021-04-20 ENCOUNTER — Other Ambulatory Visit (HOSPITAL_BASED_OUTPATIENT_CLINIC_OR_DEPARTMENT_OTHER): Payer: Self-pay | Admitting: Nurse Practitioner

## 2021-04-20 DIAGNOSIS — E559 Vitamin D deficiency, unspecified: Secondary | ICD-10-CM

## 2021-04-20 DIAGNOSIS — E038 Other specified hypothyroidism: Secondary | ICD-10-CM

## 2021-04-20 MED ORDER — VITAMIN D (ERGOCALCIFEROL) 1.25 MG (50000 UNIT) PO CAPS
50000.0000 [IU] | ORAL_CAPSULE | ORAL | 0 refills | Status: DC
  Filled 2021-04-20: qty 12, 84d supply, fill #0

## 2021-04-21 ENCOUNTER — Ambulatory Visit (INDEPENDENT_AMBULATORY_CARE_PROVIDER_SITE_OTHER): Payer: Medicaid Other | Admitting: Nurse Practitioner

## 2021-04-21 ENCOUNTER — Other Ambulatory Visit (HOSPITAL_BASED_OUTPATIENT_CLINIC_OR_DEPARTMENT_OTHER): Payer: Self-pay

## 2021-04-21 ENCOUNTER — Encounter (HOSPITAL_BASED_OUTPATIENT_CLINIC_OR_DEPARTMENT_OTHER): Payer: Self-pay | Admitting: Nurse Practitioner

## 2021-04-21 ENCOUNTER — Other Ambulatory Visit: Payer: Self-pay

## 2021-04-21 VITALS — BP 117/72 | HR 94 | Temp 99.9°F | Ht 68.0 in | Wt 149.0 lb

## 2021-04-21 DIAGNOSIS — H66003 Acute suppurative otitis media without spontaneous rupture of ear drum, bilateral: Secondary | ICD-10-CM | POA: Diagnosis not present

## 2021-04-21 DIAGNOSIS — R110 Nausea: Secondary | ICD-10-CM

## 2021-04-21 DIAGNOSIS — J029 Acute pharyngitis, unspecified: Secondary | ICD-10-CM | POA: Diagnosis not present

## 2021-04-21 DIAGNOSIS — R051 Acute cough: Secondary | ICD-10-CM | POA: Diagnosis not present

## 2021-04-21 DIAGNOSIS — G43019 Migraine without aura, intractable, without status migrainosus: Secondary | ICD-10-CM

## 2021-04-21 MED ORDER — AMOXICILLIN-POT CLAVULANATE 875-125 MG PO TABS
1.0000 | ORAL_TABLET | Freq: Two times a day (BID) | ORAL | 0 refills | Status: DC
  Filled 2021-04-21: qty 10, 5d supply, fill #0

## 2021-04-21 MED ORDER — LIDOCAINE VISCOUS HCL 2 % MT SOLN
15.0000 mL | OROMUCOSAL | 1 refills | Status: DC | PRN
  Filled 2021-04-21: qty 100, 1d supply, fill #0

## 2021-04-21 MED ORDER — HYDROCODONE BIT-HOMATROP MBR 5-1.5 MG/5ML PO SOLN
5.0000 mL | Freq: Every day | ORAL | 0 refills | Status: DC
  Filled 2021-04-21: qty 60, 12d supply, fill #0

## 2021-04-21 MED ORDER — BENZONATATE 100 MG PO CAPS
200.0000 mg | ORAL_CAPSULE | Freq: Three times a day (TID) | ORAL | 1 refills | Status: AC | PRN
  Filled 2021-04-21: qty 40, 7d supply, fill #0

## 2021-04-21 MED ORDER — RIZATRIPTAN BENZOATE 10 MG PO TABS
10.0000 mg | ORAL_TABLET | ORAL | 0 refills | Status: DC | PRN
  Filled 2021-04-21: qty 10, 5d supply, fill #0

## 2021-04-21 NOTE — Patient Instructions (Addendum)
I have sent in an antibiotic for you to take. Take this as soon as you pick it up today and then take the second dose before bed. Be sure to take with food so your stomach does not get upset. Please be sure you take this for all 5 days to be sure to get rid of all of the infection.  ? ?It looks like you have an infection in both of your ears and based on how red your throat is, it looks infected too. This medicine should help clear this up.  ? ?I have sent in a lidocaine swish- you can gargle with this and spit it out to help with the throat pain. It may burn when you first use it, but it should help with the pain significantly.  ? ?I have also sent in [place] to help with your cough. These can be used during the day or night to help reduce your cough.  ? ?You may take Tylenol and Ibuprofen for pain- alternate each medication every 4 hours to keep it in your system.  ?Tylenol '1000mg'$  ?Ibuprofen '600mg'$  ? ?I want you to take the prilosec for heart burn daily for the next 30 days to see if this will help slow down your heartburn symptoms.  ? ?If you are not feeling better by Monday, please call the office [phone], and speak to [place]. We may need to send in a steroid burst to help.  ? ? ? ?

## 2021-04-24 ENCOUNTER — Telehealth (HOSPITAL_BASED_OUTPATIENT_CLINIC_OR_DEPARTMENT_OTHER): Payer: Self-pay

## 2021-04-24 ENCOUNTER — Encounter (HOSPITAL_BASED_OUTPATIENT_CLINIC_OR_DEPARTMENT_OTHER): Payer: Self-pay | Admitting: Nurse Practitioner

## 2021-04-24 ENCOUNTER — Encounter (HOSPITAL_BASED_OUTPATIENT_CLINIC_OR_DEPARTMENT_OTHER): Payer: Self-pay | Admitting: Physical Therapy

## 2021-04-24 DIAGNOSIS — E038 Other specified hypothyroidism: Secondary | ICD-10-CM

## 2021-04-24 MED ORDER — LEVOTHYROXINE SODIUM 25 MCG PO TABS: 25.0000 ug | ORAL_TABLET | Freq: Every day | ORAL | 0 refills | Status: DC

## 2021-04-26 ENCOUNTER — Encounter (HOSPITAL_BASED_OUTPATIENT_CLINIC_OR_DEPARTMENT_OTHER): Payer: Self-pay | Admitting: Nurse Practitioner

## 2021-05-02 ENCOUNTER — Other Ambulatory Visit: Payer: Self-pay

## 2021-05-02 ENCOUNTER — Encounter (HOSPITAL_BASED_OUTPATIENT_CLINIC_OR_DEPARTMENT_OTHER): Payer: Self-pay | Admitting: Physical Therapy

## 2021-05-02 ENCOUNTER — Ambulatory Visit (HOSPITAL_BASED_OUTPATIENT_CLINIC_OR_DEPARTMENT_OTHER): Payer: Medicaid Other | Attending: Nurse Practitioner | Admitting: Physical Therapy

## 2021-05-02 DIAGNOSIS — M542 Cervicalgia: Secondary | ICD-10-CM

## 2021-05-02 DIAGNOSIS — X58XXXA Exposure to other specified factors, initial encounter: Secondary | ICD-10-CM | POA: Diagnosis not present

## 2021-05-02 DIAGNOSIS — H66003 Acute suppurative otitis media without spontaneous rupture of ear drum, bilateral: Secondary | ICD-10-CM | POA: Insufficient documentation

## 2021-05-02 DIAGNOSIS — M5432 Sciatica, left side: Secondary | ICD-10-CM | POA: Diagnosis not present

## 2021-05-02 DIAGNOSIS — M546 Pain in thoracic spine: Secondary | ICD-10-CM

## 2021-05-02 HISTORY — DX: Acute suppurative otitis media without spontaneous rupture of ear drum, bilateral: H66.003

## 2021-05-02 NOTE — Progress Notes (Signed)
? ?Acute Office Visit ? ?Subjective:  ? ? Patient ID: Margaret [name], female    DOB: Jul 01, 1985, 36 y.o.   MRN: [id number] ? ?Chief Complaint  ?Patient presents with  ? Follow-up  ?  Patient presents today from follow up of ED visit on 04/17/21. Still has cough more productive. Headache and bilateral ear pain. Bilateral ears are red  ? ? ?HPI ?Patient is in today for upper respiratory symptoms and ear pain.  ?URI ?- endorses cough, sore throat, pain and pressure in ears bilaterally, headache. ?- burning sensation in chest with coughing ?- started about 7-8 days ago ?- seen in ED 03/06 for same sx ?- subjective fever, chills present.  ?- toradol was helpful to relieve symptoms in ED for HA ?- no known sick contacts ?- symptoms are worsening after initially improving ? ?Outpatient Medications Prior to Visit  ?Medication Sig Dispense Refill  ? alum & mag hydroxide-simeth (MAALOX/MYLANTA) 200-200-20 MG/5ML suspension Take 15 mLs by mouth every 6 (six) hours as needed for indigestion or heartburn. 355 mL 0  ? Cholecalciferol 50 MCG (2000 UT) CAPS Take 1 capsule (2,000 Units total) by mouth daily. 30 capsule 2  ? cyclobenzaprine (FLEXERIL) 10 MG tablet Take 1 tablet (10 mg total) by mouth 3 (three) times daily as needed for muscle spasms. 30 tablet 0  ? hydrOXYzine (ATARAX) 50 MG tablet Take 1 tablet (50 mg total) by mouth 3 (three) times daily as needed for anxiety. 30 tablet 0  ? ibuprofen (ADVIL) 600 MG tablet Take 1 tablet (600 mg total) by mouth every 8 (eight) hours as needed. 15 tablet 0  ? lamoTRIgine (LAMICTAL) 150 MG tablet Take 1 tablet (150 mg total) by mouth 2 (two) times daily. 180 tablet 3  ? MAGNESIUM PO Take by mouth.    ? methocarbamol (ROBAXIN) 750 MG tablet Take 1 tablet (750 mg total) by mouth 3 (three) times daily as needed (muscle spasm/pain). 15 tablet 0  ? omeprazole (PRILOSEC) 20 MG capsule Take 1 capsule (20 mg total) by mouth daily. 30 capsule 0  ? polyethylene glycol powder (GLYCOLAX/MIRALAX) 17  GM/SCOOP powder Take 17 g by mouth 2 (two) times daily as needed. 3350 g 1  ? sertraline (ZOLOFT) 25 MG tablet Take 1 tablet (25 mg total) by mouth daily. 30 tablet 0  ? Vitamin D, Ergocalciferol, (DRISDOL) 1.25 MG (50000 UNIT) CAPS capsule Take 1 capsule (50,000 Units total) by mouth every 7 (seven) days. Take for 12 total doses (weeks) then can transition to 1000 units OTC supplement daily 12 capsule 0  ? ?No facility-administered medications prior to visit.  ? ? ?No Known Allergies ? ?Review of Systems ?All review of systems negative except what is listed in the HPI ? ? ?   ?Objective:  ?  ?Physical Exam ?Vitals and nursing note reviewed.  ?Constitutional:   ?   Appearance: She is ill-appearing.  ?HENT:  ?   Head: Normocephalic.  ?   Right Ear: Tenderness present. A middle ear effusion is present.  ?   Left Ear: Tenderness present. A middle ear effusion is present.  ?   Nose: Mucosal edema, congestion and rhinorrhea present. Rhinorrhea is purulent.  ?   Right Turbinates: Enlarged and swollen.  ?   Left Turbinates: Enlarged and swollen.  ?   Right Sinus: Maxillary sinus tenderness and frontal sinus tenderness present.  ?   Left Sinus: Maxillary sinus tenderness and frontal sinus tenderness present.  ?   Mouth/Throat:  ?  Mouth: Mucous membranes are moist.  ?   Pharynx: Posterior oropharyngeal erythema present.  ?   Tonsils: No tonsillar exudate.  ?Eyes:  ?   Extraocular Movements: Extraocular movements intact.  ?   Pupils: Pupils are equal, round, and reactive to light.  ?Cardiovascular:  ?   Rate and Rhythm: Normal rate and regular rhythm.  ?   Pulses: Normal pulses.  ?   Heart sounds: Normal heart sounds.  ?Pulmonary:  ?   Effort: Pulmonary effort is normal.  ?   Breath sounds: Wheezing present.  ?Abdominal:  ?   General: Bowel sounds are normal.  ?   Palpations: Abdomen is soft.  ?Musculoskeletal:  ?   Cervical back: Normal range of motion.  ?   Right lower leg: No edema.  ?   Left lower leg: No edema.   ?Lymphadenopathy:  ?   Cervical: Cervical adenopathy present.  ?Skin: ?   General: Skin is warm and dry.  ?   Capillary Refill: Capillary refill takes less than 2 seconds.  ?Neurological:  ?   General: No focal deficit present.  ?   Mental Status: She is alert and oriented to person, place, and time.  ?Psychiatric:     ?   Mood and Affect: Mood normal.     ?   Behavior: Behavior normal.     ?   Thought Content: Thought content normal.     ?   Judgment: Judgment normal.  ? ? ?BP 117/72   Pulse 94   Temp 99.9 ?F (37.7 ?C)   Ht '5\' 8"'$  (1.727 m)   Wt 149 lb (67.6 kg)   LMP 04/05/2021 (Exact Date)   SpO2 97%   BMI 22.66 kg/m?  ?Wt Readings from Last 3 Encounters:  ?04/21/21 149 lb (67.6 kg)  ?04/17/21 151 lb 0.2 oz (68.5 kg)  ?04/13/21 151 lb (68.5 kg)  ? ? ? ? ?   ?Assessment & Plan:  ? ?Problem List Items Addressed This Visit   ? ? Non-recurrent acute suppurative otitis media of both ears without spontaneous rupture of tympanic membranes - Primary  ?  Symptoms and presentation consistent with bilateral otitis media. TM intact.  ?Given length of time symptoms have been present, will send antibiotic treatment today.  ?Recommend rest and increased hydration and monitoring for new or worsening symptoms. F/U if symptoms do not improve.  ?  ?  ? Relevant Medications  ? amoxicillin-clavulanate (AUGMENTIN) 875-125 MG tablet  ? ?Other Visit Diagnoses   ? ? Sore throat      ? Relevant Medications  ? lidocaine (XYLOCAINE) 2 % solution  ? Acute cough      ? Relevant Medications  ? HYDROcodone bit-homatropine (HYCODAN) 5-1.5 MG/5ML syrup  ? Nausea      ? Intractable migraine without aura and without status migrainosus      ? Relevant Medications  ? rizatriptan (MAXALT) 10 MG tablet  ? ?  ? ? ? ?Meds ordered this encounter  ?Medications  ? amoxicillin-clavulanate (AUGMENTIN) 875-125 MG tablet  ?  Sig: Take 1 tablet by mouth 2 (two) times daily.  ?  Dispense:  10 tablet  ?  Refill:  0  ? lidocaine (XYLOCAINE) 2 % solution  ?   Sig: Use as directed 15 mLs in the mouth or throat every 3 (three) hours as needed (mouth/throat pain - gargle and spit).  ?  Dispense:  100 mL  ?  Refill:  1  ? benzonatate (TESSALON) 100  MG capsule  ?  Sig: Take 2 capsules (200 mg total) by mouth 3 (three) times daily as needed for up to 7 days for cough.  ?  Dispense:  40 capsule  ?  Refill:  1  ? HYDROcodone bit-homatropine (HYCODAN) 5-1.5 MG/5ML syrup  ?  Sig: Take 5 mLs by mouth at bedtime.  ?  Dispense:  60 mL  ?  Refill:  0  ? rizatriptan (MAXALT) 10 MG tablet  ?  Sig: Take 1 tablet (10 mg total) by mouth as needed for migraine. May repeat in 2 hours if needed. Do not take more than 2 doses in 24 hours.  ?  Dispense:  10 tablet  ?  Refill:  0  ? ? ? ?Orma Render, NP ? ?
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
## 2021-05-02 NOTE — Assessment & Plan Note (Signed)
Symptoms and presentation consistent with bilateral otitis media. TM intact.  ?Given length of time symptoms have been present, will send antibiotic treatment today.  ?Recommend rest and increased hydration and monitoring for new or worsening symptoms. F/U if symptoms do not improve.  ?

## 2021-05-09 ENCOUNTER — Telehealth (HOSPITAL_BASED_OUTPATIENT_CLINIC_OR_DEPARTMENT_OTHER): Payer: Self-pay

## 2021-05-09 NOTE — Telephone Encounter (Signed)
Recevied fax from [phone] to sign and confirm that a support animal documentation was written by Laretta Bolster. The form was signed and faxed back to [phone]. ?

## 2021-05-17 ENCOUNTER — Ambulatory Visit (INDEPENDENT_AMBULATORY_CARE_PROVIDER_SITE_OTHER): Payer: Medicaid Other | Admitting: Psychologist

## 2021-05-17 DIAGNOSIS — F331 Major depressive disorder, recurrent, moderate: Secondary | ICD-10-CM

## 2021-05-17 DIAGNOSIS — F4310 Post-traumatic stress disorder, unspecified: Secondary | ICD-10-CM | POA: Diagnosis not present

## 2021-05-17 NOTE — Progress Notes (Signed)
                Adan Beal, PsyD 
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
## 2021-05-17 NOTE — Plan of Care (Signed)
Goals ?Alleviate depressive symptoms ?Recognize, accept, and cope with depressive feelings ?Develop healthy thinking patterns ?Develop healthy interpersonal relationships ? ?Objectives ?Cooperate with a medication evaluation by a physician ?Verbalize an accurate understanding of depression ?Verbalize an understanding of the treatment ?Identify and replace thoughts that support depression ?Learn and implement behavioral strategies ?Verbalize an understanding and resolution of current interpersonal problems ?Learn and implement problem solving and decision making skills ?Learn and implement conflict resolution skills to resolve interpersonal problems ?Verbalize an understanding of healthy and unhealthy emotions verbalize insight into how past relationships may be influence current experiences with depression ?Use mindfulness and acceptance strategies and increase value based behavior  ?Increase hopeful statements about the future.  ?Interventions ?Consistent with treatment model, discuss how change in cognitive, behavioral, and interpersonal can help client alleviate depression ?CBT ?Behavioral activation help the client explore the relationship, nature of the dispute,  ?Help the client develop new interpersonal skills and relationships ?Conduct Problem so living therapy ?Teach conflict resolution skills ?Use a process-experiential approach ?Conduct TLDP ?Conduct ACT ?Evaluate need for psychotropic medication ?Monitor adherence to medication  ? ?Goals ?Reduce overall frequency, intensity, and duration of trauma related symptoms ?Stabilize  trauma while increasing ability to function ?Enhance ability to effectively cope with triggers, reminders, flashbacks, moods, and cognitions of trauma ?Learn and implement coping skills that result in a reduction of trauma related symptomsl ? ?Objectives ?Verbalize an understanding of the cognitive, physiological, and behavioral components of post traumatic stress disorder ?Describe  in detail reminders of the triggers related to trauma  ?Learn and implement strategies to manage thoughts, behaviors, and feelings of trauma ?Learning and implement calming skills to reduce overall trauma ?Verbalize an understanding of the role that cognitive biases play in excessive irrational worry and persistent trauma symptoms ?Learn and implement personal skills to manage challenging situations ?Learn and implement problem solving strategies ?Identify and engage in pleasant activities ?Learn to accept limitations in life and commit to tolerating, rather than avoiding, unpleasant emotions while accomplishing meaningful goals ?Maintain involvement in work, family, and social activities ?Reestablish a consistent sleep-wake cycle ?Cooperate with a medical evaluation  ?Interventions ?Engage the patient in behavioral activation ?Use instruction, modeling, and role-playing to build the client's general social, communication, and/or conflict resolution skills ?Use Acceptance and Commitment Therapy to help client accept uncomfortable realities in order to accomplish value-consistent goals ?Support the client in following through with work, family, and social activities ?Teach and implement sleep hygiene practices  ?Refer the patient to a physician for a psychotropic medication consultation ?Monito the clint's psychotropic medication compliance ?Discuss how trauma typically involves excessive worry, various bodily expressions of tension, and avoidance of what is threatening that interact to maintain the problem  ?Teach the patient relaxation skills ?Assign the patient homework ?Discuss examples demonstrating that unrealistic worry overestimates the probability of threats and underestimates patient's ability  ?Assist the patient in analyzing his or her worries ?Help patient understand that avoidance is reinforcing  ? ? ?

## 2021-05-29 ENCOUNTER — Ambulatory Visit (INDEPENDENT_AMBULATORY_CARE_PROVIDER_SITE_OTHER): Payer: Medicaid Other | Admitting: Psychologist

## 2021-05-29 DIAGNOSIS — F331 Major depressive disorder, recurrent, moderate: Secondary | ICD-10-CM

## 2021-05-29 DIAGNOSIS — F4310 Post-traumatic stress disorder, unspecified: Secondary | ICD-10-CM

## 2021-05-29 NOTE — Progress Notes (Signed)
                Canton Yearby, PsyD 

## 2021-05-29 NOTE — Progress Notes (Signed)
[facility] Counselor/Therapist Progress Note ? ?Patient ID: Margaret [name], MRN: [id number],   ? ?Date: 05/29/2021 ? ?Time Spent: 8:03 am to 8:41 am; total time: 38 minutes ? ? This session was held via video webex teletherapy due to the coronavirus risk at this time. The patient consented to video teletherapy and was located at her home during this session. She is aware it is the responsibility of the patient to secure confidentiality on her end of the session. The provider was in a private home office for the duration of this session. Limits of confidentiality were discussed with the patient.  ? ?Treatment Type: Individual Therapy ? ?Reported Symptoms: PTSD and depression ? ?Mental Status Exam: ?Appearance:  Casual     ?Behavior: Appropriate  ?Motor: Normal  ?Speech/Language:  Clear and Coherent  ?Affect: Flat  ?Mood: dysthymic  ?Thought process: normal  ?Thought content:   WNL  ?Sensory/Perceptual disturbances:   WNL  ?Orientation: oriented to [name], place, and time/date  ?Attention: Good  ?Concentration: Good  ?Memory: WNL  ?Fund of knowledge:  Good  ?Insight:   Fair  ?Judgment:  Fair  ?Impulse Control: Good  ? ?Risk Assessment: ?Danger to Self:  No ?Self-injurious Behavior: No ?Danger to Others: No ?Duty to Warn:no ?Physical Aggression / Violence:No  ?Access to Firearms a concern: No  ?Gang Involvement:No  ? ?Subjective: Beginning the patient, the patient described herself as doing well. After reviewing the treatment plan, patient stated that she wanted coping strategies. After discussing, practicing, and processing coping strategies she voiced that some were more helpful than others. She was agreeable to homework and following up. She denied suicidal and homicidal ideation.   ? ?Interventions:  Worked on developing a therapeutic relationship with the patient using active listening and reflective statements. Provided emotional support using empathy and validation. Reviewed the treatment plan with  the patient. Reviewed events since the intake occurred. Normalized and validated expressed thoughts and emotions. Provided psychoeducation about mindfulness, mindful moments, defusion, guided imagery, and the worry box. Practiced and processed mindfulness, defusion, and guided imagery. Praised patient for experiencing less distress with mindfulness and defusion. Processed patient's experience with guided imagery. Validated patient's experience. Assisted in problem solving. Encouraged patient to practice multiple times daily for two to three minutes. Assigned homework. Assessed for suicidal and homicidal ideation.  ? ?Homework: Implement mindfulness, defusion, and the worry box. ? ?Next Session: Review homework, PMR, and emotional support ? ?Diagnosis: F43.10 posttraumatic stress disorder and F33.1 major depressive affective disorder, recurrent, moderate ? ?Plan:  ? ?Goals ?Alleviate depressive symptoms ?Recognize, accept, and cope with depressive feelings ?Develop healthy thinking patterns ?Develop healthy interpersonal relationships ?Reduce overall frequency, intensity, and duration of trauma related symptoms ?Stabilize  trauma while increasing ability to function ?Enhance ability to effectively cope with triggers, reminders, flashbacks, moods, and cognitions of trauma ?Learn and implement coping skills that result in a reduction of trauma related symptomsl ? ?Objectives target date for all objectives is 05/18/2022 ?Cooperate with a medication evaluation by a physician ?Verbalize an accurate understanding of depression ?Verbalize an understanding of the treatment ?Identify and replace thoughts that support depression ?Learn and implement behavioral strategies ?Verbalize an understanding and resolution of current interpersonal problems ?Learn and implement problem solving and decision making skills ?Learn and implement conflict resolution skills to resolve interpersonal problems ?Verbalize an understanding of healthy  and unhealthy emotions verbalize insight into how past relationships may be influence current experiences with depression ?Use mindfulness and acceptance strategies and increase value based behavior  ?  Increase hopeful statements about the future.  ?Verbalize an understanding of the cognitive, physiological, and behavioral components of post traumatic stress disorder ?Describe in detail reminders of the triggers related to trauma  ?Learn and implement strategies to manage thoughts, behaviors, and feelings of trauma ?Learning and implement calming skills to reduce overall trauma ?Verbalize an understanding of the role that cognitive biases play in excessive irrational worry and persistent trauma symptoms ?Learn and implement personal skills to manage challenging situations ?Learn and implement problem solving strategies ?Identify and engage in pleasant activities ?Learn to accept limitations in life and commit to tolerating, rather than avoiding, unpleasant emotions while accomplishing meaningful goals ?Maintain involvement in work, family, and social activities ?Reestablish a consistent sleep-wake cycle ?Cooperate with a medical evaluation  ?Interventions ?Consistent with treatment model, discuss how change in cognitive, behavioral, and interpersonal can help client alleviate depression ?CBT ?Behavioral activation help the client explore the relationship, nature of the dispute,  ?Help the client develop new interpersonal skills and relationships ?Conduct Problem so living therapy ?Teach conflict resolution skills ?Use a process-experiential approach ?Conduct TLDP ?Conduct ACT ?Evaluate need for psychotropic medication ?Monitor adherence to medication  ?Engage the patient in behavioral activation ?Use instruction, modeling, and role-playing to build the client's general social, communication, and/or conflict resolution skills ?Use Acceptance and Commitment Therapy to help client accept uncomfortable realities in order  to accomplish value-consistent goals ?Support the client in following through with work, family, and social activities ?Teach and implement sleep hygiene practices  ?Refer the patient to a physician for a psychotropic medication consultation ?Monito the clint's psychotropic medication compliance ?Discuss how trauma typically involves excessive worry, various bodily expressions of tension, and avoidance of what is threatening that interact to maintain the problem  ?Teach the patient relaxation skills ?Assign the patient homework ?Discuss examples demonstrating that unrealistic worry overestimates the probability of threats and underestimates patient's ability  ?Assist the patient in analyzing his or her worries ?Help patient understand that avoidance is reinforcing  ? ?The patient and clinician reviewed the treatment plan on 05/29/2021. The patient approved of the treatment plan.  ? ?Conception Chancy, PsyD ? ? ? ?

## 2021-06-12 ENCOUNTER — Ambulatory Visit: Payer: Medicaid Other | Admitting: Psychologist

## 2021-06-16 ENCOUNTER — Ambulatory Visit (INDEPENDENT_AMBULATORY_CARE_PROVIDER_SITE_OTHER): Payer: Medicaid Other | Admitting: Nurse Practitioner

## 2021-06-16 ENCOUNTER — Encounter (HOSPITAL_BASED_OUTPATIENT_CLINIC_OR_DEPARTMENT_OTHER): Payer: Self-pay | Admitting: Nurse Practitioner

## 2021-06-16 VITALS — BP 122/82 | HR 89 | Ht 68.0 in | Wt 146.4 lb

## 2021-06-16 DIAGNOSIS — E559 Vitamin D deficiency, unspecified: Secondary | ICD-10-CM

## 2021-06-16 DIAGNOSIS — D509 Iron deficiency anemia, unspecified: Secondary | ICD-10-CM | POA: Diagnosis not present

## 2021-06-16 DIAGNOSIS — F419 Anxiety disorder, unspecified: Secondary | ICD-10-CM | POA: Diagnosis not present

## 2021-06-16 DIAGNOSIS — E038 Other specified hypothyroidism: Secondary | ICD-10-CM

## 2021-06-16 DIAGNOSIS — F32A Anxiety disorder, unspecified: Secondary | ICD-10-CM

## 2021-06-16 NOTE — Assessment & Plan Note (Signed)
Repeat labs today

## 2021-06-16 NOTE — Assessment & Plan Note (Signed)
Currently not taking levothyroxine at this time. Will recheck thyroid levels today to determine if this needs to be restarted. Will make changes to plan of care based on results.  ?

## 2021-06-16 NOTE — Patient Instructions (Addendum)
It was a pleasure seeing you today. I hope your time spent with [place] was pleasant and helpful. Please let us know if there is anything we can do to improve the service you receive.  ? ? ?I have provided you with a note for work and [employer]. If you should have any further concerns, please let me know.  ? ?I will let you know what your labs show once we get these back and I have reviewed them.  ? ?Important Office Information ?Lab Results ?If labs were ordered, please note that you will see results through [facility] as soon as they come available from [facility].  ?It takes up to 5 business days for the results to be routed to me and for me to review them once all of the lab results have come through from [facility]. I will make recommendations based on your results and send these through [place] or someone from the office will call you to discuss. If your labs are abnormal, we may contact you to schedule a visit to discuss the results and make recommendations.  ?If you have not heard from [place] within 5 business days or you have waited longer than a week and your lab results have not come through on [place], please feel free to call the office or send a message through [facility] to follow-up on these labs.  ? ?Referrals ?If referrals were placed today, the office where the referral was sent will contact you either by phone or through [place] to set up scheduling. Please note that it can take up to a week for the referral office to contact you. If you do not hear from them in a week, please contact the referral office directly to inquire about scheduling.  ? ?Condition Treated ?If your condition worsens or you begin to have new symptoms, please schedule a follow-up appointment for further evaluation. If you are not sure if an appointment is needed, you may call the office to leave a message for the nurse and someone will contact you with recommendations.  ?If you have an urgent or life threatening emergency, please do  not call the office, but seek emergency evaluation by calling 911 or going to the nearest emergency room for evaluation.  ? ?MyChart and Phone Calls ?Please do not use MyChart for urgent messages. It may take up to 3 business days for MyChart messages to be read by staff and if they are unable to handle the request, an additional 3 business days for them to be routed to me and for my response.  ?Messages sent to the provider through [place] do not come directly to the provider, please allow time for these messages to be routed and for me to respond.  ?We get a large volume of MyChart messages daily and these are responded to in the order received.  ? ?For urgent messages, please call the office at [phone] and speak with the front office staff or leave a message on the line of my assistant for guidance.  ?We are seeing patients from the hours of 8:00 am through 5:00 pm and calls directly to the nurse may not be answered immediately due to seeing patients, but your call will be returned as soon as possible.  ?Phone  messages received after 4:00 PM Monday through Thursday may not be returned until the following business day. Phone messages received after 11:00 AM on Friday may not be returned until Monday.  ? ?After Hours ?We share on call hours  with providers from other offices. If you have an urgent need after hours that cannot wait until the next business day, please contact the on call provider by calling the office number. A nurse will speak with you and contact the provider if needed for recommendations.  ?If you have an urgent or life threatening emergency after hours, please do not call the on call provider, but seek emergency evaluation by calling 911 or going to the nearest emergency room for evaluation.  ? ?Paperwork ?All paperwork requires a minimum of 5 days to complete and return to you or the designated personnel. Please keep this in mind when bringing in forms or sending requests for paperwork  completion to the office.  ?  ?
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
## 2021-06-16 NOTE — Assessment & Plan Note (Signed)
Recheck labs today. 
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
## 2021-06-16 NOTE — Assessment & Plan Note (Signed)
Improvement with movement and pain. Limitations include standing for long periods of time with no other concerns at this time. Recommend accommodations with work to allow her to be seated or frequent rest periods to reduce this pain. Letter provided at patients request for insurance to release her. At this time, I feel this is appropriate, but we will readdress any issues if they should arise later.  ?

## 2021-06-17 LAB — THYROID PANEL WITH TSH
Free Thyroxine Index: 2 (ref 1.2–4.9)
T3 Uptake Ratio: 27 % (ref 24–39)
T4, Total: 7.5 ug/dL (ref 4.5–12.0)
TSH: 2.97 u[IU]/mL (ref 0.450–4.500)

## 2021-06-17 LAB — IRON,TIBC AND FERRITIN PANEL
Ferritin: 8 ng/mL — ABNORMAL LOW (ref 15–150)
Iron Saturation: 18 % (ref 15–55)
Iron: 75 ug/dL (ref 27–159)
Total Iron Binding Capacity: 413 ug/dL (ref 250–450)
UIBC: 338 ug/dL (ref 131–425)

## 2021-06-17 LAB — CBC WITH DIFFERENTIAL/PLATELET
Basophils Absolute: 0.1 10*3/uL (ref 0.0–0.2)
Basos: 1 %
EOS (ABSOLUTE): 0.1 10*3/uL (ref 0.0–0.4)
Eos: 3 %
Hematocrit: 35.5 % (ref 34.0–46.6)
Hemoglobin: 11.6 g/dL (ref 11.1–15.9)
Immature Grans (Abs): 0 10*3/uL (ref 0.0–0.1)
Immature Granulocytes: 0 %
Lymphocytes Absolute: 1.6 10*3/uL (ref 0.7–3.1)
Lymphs: 31 %
MCH: 28.9 pg (ref 26.6–33.0)
MCHC: 32.7 g/dL (ref 31.5–35.7)
MCV: 88 fL (ref 79–97)
Monocytes Absolute: 0.5 10*3/uL (ref 0.1–0.9)
Monocytes: 9 %
Neutrophils Absolute: 2.8 10*3/uL (ref 1.4–7.0)
Neutrophils: 56 %
Platelets: 234 10*3/uL (ref 150–450)
RBC: 4.02 x10E6/uL (ref 3.77–5.28)
RDW: 13 % (ref 11.7–15.4)
WBC: 5 10*3/uL (ref 3.4–10.8)

## 2021-06-17 LAB — VITAMIN D 25 HYDROXY (VIT D DEFICIENCY, FRACTURES): Vit D, 25-Hydroxy: 37.6 ng/mL (ref 30.0–100.0)

## 2021-06-17 LAB — B12 AND FOLATE PANEL
Folate: 7.8 ng/mL (ref >3.0)
Vitamin B-12: 508 pg/mL (ref 232–1245)

## 2021-06-26 ENCOUNTER — Ambulatory Visit: Payer: Medicaid Other | Admitting: Neurology

## 2021-06-26 ENCOUNTER — Encounter: Payer: Self-pay | Admitting: Neurology

## 2021-06-26 VITALS — BP 106/68 | HR 67 | Ht 68.0 in | Wt 148.0 lb

## 2021-06-26 DIAGNOSIS — G40309 Generalized idiopathic epilepsy and epileptic syndromes, not intractable, without status epilepticus: Secondary | ICD-10-CM

## 2021-06-26 MED ORDER — LAMOTRIGINE 150 MG PO TABS: 150.0000 mg | ORAL_TABLET | Freq: Two times a day (BID) | ORAL | 3 refills | Status: DC

## 2021-06-26 NOTE — Progress Notes (Signed)
? ?NEUROLOGY FOLLOW UP OFFICE NOTE ? ?Rateel Beldin ?[id number] ?1985-05-10 ? ?HISTORY OF PRESENT ILLNESS: ?I had the pleasure of seeing Gearline Spilman in follow-up in the neurology clinic on 06/26/2021.  The patient was last seen 10 months ago for seizures suggestive of Juvenile Myoclonic Epilepsy. She is alone in the office today. She continues to be seizure-free since 2012 when Lamotrigine '150mg'$  BID was initiated. She has rare myoclonic jerks in her head, usually when stressed or sleep deprived. She denies any staring/unresponsive episodes, gaps in time, olfactory/gustatory hallucinations, focal numbness/tingling/weakness. Headaches are mostly stress-related, she takes Tylenol or Ibuprofen and feels fine. They are also more related to her menstrual period. She has occasional dizziness attributed to diet. No diplopia, no falls. She works 8 hours remotely on the computer, which is a lot of stress for her. She is traveling to [place] for the summer. ? ? ?History on Initial Assessment 09/30/2019: This is a 36 year old right-handed woman presenting to establish care for epilepsy. Seizures started in 2003/2004 while she was still living in [place]. There was no prior warning, she recalls cooking, then waking up on the bed. She was told she had full body shaking and was staring for a long time after. She slept all day afterwards. She was unable to seek medical care at that time. She had another seizure in 2006 while at [employer] in [place], she was sitting then suddenly had a GTC. The next seizure occurred in 2010 when she was studying in the [place]. An EEG and MRI were recommended, she had these studies in [place] and was told MRI brain was fine but the EEG showed abnormal activity. She was started on Levetiracetam in [place] but had another seizure and was started on Lamotrigine '150mg'$  BID in 2012. She denies any further convulsions since then. No side effects on lamotrigine. She lives with her husband and 2 children (ages 39 and  26), and denies any staring/unresponsive episodes, gaps in time, olfactory/gustatory hallucinations. She has myoclonic jerks affecting her head when she is stressed out, sleep deprived, but most noticeable when she is concentrating a lot on her laptop. Extremities are not affected. She recalls prior to one of her seizures, she had a head jerk, then 5-10 seconds later she had a GTC and woke up in the hospital. Last time she recalls having a head jerk was in Jan/Feb 2021. She denies any focal numbness/tingling/weakness. Once or twice she woke up with blood on her pillow, her husband has not mentioned any nocturnal convulsions. She has headaches around the time of her period. She has occasional neck pain, occasional constipation. She has noticed for the past couple of months, she gets angry easily. She has a history of being short-tempered but now little things happen and she screams at her children. Sleep is good. She denies any dizziness, diplopia, dysarthria/dysphagia, back pain, bladder dysfunction.She works as a Community education officer at [employer]. No current pregnancy plans, she is not on contraception.  ? ?Epilepsy Risk Factors:  Her maternal uncle and maternal first cousin have seizures. Otherwise she had a normal birth and early development.  There is no history of febrile convulsions, CNS infections such as meningitis/encephalitis, significant traumatic brain injury, neurosurgical procedures. ? ?Prior AEDs: Levetiracetam ? ?Diagnostic Data: ?Patient reports having a repeat abnormal EEG done in 2016, records will be requested from Dr. Pattricia Boss in [facility]. ? ?PAST MEDICAL HISTORY: ?Past Medical History:  ?Diagnosis Date  ? Appendicitis   ? Coccyx pain 10/11/2019  ? Constipation 10/08/2019  ? ? ?  MEDICATIONS: ?Current Outpatient Medications on File Prior to Visit  ?Medication Sig Dispense Refill  ? lamoTRIgine (LAMICTAL) 150 MG tablet Take 1 tablet (150 mg total) by mouth 2 (two) times daily. 180 tablet 3  ? hydrOXYzine (ATARAX) 50 MG  tablet Take 1 tablet (50 mg total) by mouth 3 (three) times daily as needed for anxiety. (Patient not taking: Reported on 06/16/2021) 30 tablet 0  ? omeprazole (PRILOSEC) 20 MG capsule Take 1 capsule (20 mg total) by mouth daily. 30 capsule 0  ? rizatriptan (MAXALT) 10 MG tablet Take 1 tablet (10 mg total) by mouth as needed for migraine. May repeat in 2 hours if needed. Do not take more than 2 doses in 24 hours. (Patient not taking: Reported on 06/26/2021) 10 tablet 0  ? Vitamin D, Ergocalciferol, (DRISDOL) 1.25 MG (50000 UNIT) CAPS capsule Take 1 capsule (50,000 Units total) by mouth every 7 (seven) days. Take for 12 total doses (weeks) then can transition to 1000 units OTC supplement daily (Patient not taking: Reported on 06/26/2021) 12 capsule 0  ? ?No current facility-administered medications on file prior to visit.  ? ? ?ALLERGIES: ?No Known Allergies ? ?FAMILY HISTORY: ?History reviewed. No pertinent family history. ? ?SOCIAL HISTORY: ?Social History  ? ?Socioeconomic History  ? Marital status: Married  ?  Spouse name: Not on file  ? Number of children: 2  ? Years of education: Not on file  ? Highest education level: Not on file  ?Occupational History  ? Not on file  ?Tobacco Use  ? Smoking status: Never  ? Smokeless tobacco: Never  ?Vaping Use  ? Vaping Use: Never used  ?Substance and Sexual Activity  ? Alcohol use: Never  ? Drug use: Never  ? Sexual activity: Not on file  ?Other Topics Concern  ? Not on file  ?Social History Narrative  ? Right Handed  ? [employer]  ? Drinks caffeine  ? ?Social Determinants of Health  ? ?Financial Resource Strain: Not on file  ?Food Insecurity: Not on file  ?Transportation Needs: Not on file  ?Physical Activity: Not on file  ?Stress: Not on file  ?Social Connections: Not on file  ?Intimate Partner Violence: Not on file  ? ? ? ?PHYSICAL EXAM: ?Vitals:  ? 06/26/21 1450  ?BP: 106/68  ?Pulse: 67  ?SpO2: 99%  ? ?General: No acute distress ?Head:   Normocephalic/atraumatic ?Skin/Extremities: No rash, no edema ?Neurological Exam: alert and awake. No aphasia or dysarthria. Fund of knowledge is appropriate. Attention and concentration are normal.   Cranial nerves: Pupils equal, round. Extraocular movements intact with no nystagmus. Visual fields full.  No facial asymmetry.  Motor: Bulk and tone normal, muscle strength 5/5 throughout with no pronator drift.   Finger to nose testing intact.  Gait narrow-based and steady, able to tandem walk adequately.  Romberg negative. ? ? 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 She reports having an abnormal EEG in 2010 and most recently in 2016. She reports having a normal MRI brain. Her last GTC was in 2012, she has rare myoclonic jerks with sleep deprivation and stress. Continue Lamotrigine '150mg'$  BID, refills sent. She will be traveling to [place], we discussed avoidance of seizure triggers. She is aware of [place] driving laws to stop driving after a seizure until 6 months seizure-free. Follow-up in 1 year, call for any changes.  ? ? ?Thank you for allowing me to  participate in her care.  Please do not hesitate to call for any questions or concerns. ? ? ? ?Ellouise Newer, M.D. ? ? ?CC: Jacolyn Reedy, NP ? ?

## 2021-06-26 NOTE — Patient Instructions (Signed)
Good to see you. Continue Lamotrigine '150mg'$  twice a day. Have a safe and enjoyable trip! Follow-up in 1 year, call for any changes. ? ? ?Seizure Precautions: ?1. If medication has been prescribed for you to prevent seizures, take it exactly as directed.  Do not stop taking the medicine without talking to your doctor first, even if you have not had a seizure in a long time.  ? ?2. Avoid activities in which a seizure would cause danger to yourself or to others.  Don't operate dangerous machinery, swim alone, or climb in high or dangerous places, such as on ladders, roofs, or girders.  Do not drive unless your doctor says you may. ? ?3. If you have any warning that you may have a seizure, lay down in a safe place where you can't hurt yourself.   ? ?4.  No driving for 6 months from last seizure, as per [facility].   Please refer to the following link on the [place] website for more information: http://[email or web address].org/[email or web address]/driving/drivingu.cfm  ? ?5.  Maintain good sleep hygiene. Avoid alcohol. ? ?6.  Notify your neurology if you are planning pregnancy or if you become pregnant. ? ?7.  Contact your doctor if you have any problems that may be related to the medicine you are taking. ? ?8.  Call 911 and bring the patient back to the ED if: ?      ? A.  The seizure lasts longer than 5 minutes.      ? B.  The patient doesn't awaken shortly after the seizure ? C.  The patient has new problems such as difficulty seeing, speaking or moving ? D.  The patient was injured during the seizure ? E.  The patient has a temperature over 102 F (39C) ? F.  The patient vomited and now is having trouble breathing ?      ? ?

## 2021-07-18 ENCOUNTER — Encounter (HOSPITAL_BASED_OUTPATIENT_CLINIC_OR_DEPARTMENT_OTHER): Payer: Self-pay | Admitting: Nurse Practitioner

## 2021-08-04 ENCOUNTER — Telehealth (HOSPITAL_BASED_OUTPATIENT_CLINIC_OR_DEPARTMENT_OTHER): Payer: Medicaid Other | Admitting: Nurse Practitioner

## 2021-08-23 ENCOUNTER — Ambulatory Visit: Payer: Medicaid Other | Admitting: Neurology

## 2021-10-09 ENCOUNTER — Ambulatory Visit (HOSPITAL_BASED_OUTPATIENT_CLINIC_OR_DEPARTMENT_OTHER): Payer: Medicaid Other | Admitting: Nurse Practitioner

## 2021-10-09 ENCOUNTER — Encounter (HOSPITAL_BASED_OUTPATIENT_CLINIC_OR_DEPARTMENT_OTHER): Payer: Self-pay | Admitting: Nurse Practitioner

## 2021-10-09 ENCOUNTER — Other Ambulatory Visit (HOSPITAL_BASED_OUTPATIENT_CLINIC_OR_DEPARTMENT_OTHER): Payer: Self-pay

## 2021-10-09 VITALS — BP 109/65 | HR 84 | Ht 68.0 in | Wt 145.0 lb

## 2021-10-09 DIAGNOSIS — H66006 Acute suppurative otitis media without spontaneous rupture of ear drum, recurrent, bilateral: Secondary | ICD-10-CM

## 2021-10-09 DIAGNOSIS — D509 Iron deficiency anemia, unspecified: Secondary | ICD-10-CM

## 2021-10-09 DIAGNOSIS — R946 Abnormal results of thyroid function studies: Secondary | ICD-10-CM

## 2021-10-09 DIAGNOSIS — E559 Vitamin D deficiency, unspecified: Secondary | ICD-10-CM | POA: Diagnosis not present

## 2021-10-09 MED ORDER — AMOXICILLIN-POT CLAVULANATE 875-125 MG PO TABS
1.0000 | ORAL_TABLET | Freq: Two times a day (BID) | ORAL | 0 refills | Status: DC
  Filled 2021-10-09: qty 20, 10d supply, fill #0

## 2021-10-09 MED ORDER — FLUCONAZOLE 150 MG PO TABS
ORAL_TABLET | ORAL | 2 refills | Status: DC
  Filled 2021-10-09: qty 2, 3d supply, fill #0

## 2021-10-09 NOTE — Patient Instructions (Signed)
I have sent in the antibiotic for your ears. Please let me know if this does not get better.   I have also sent in diflucan to take after you finish the antibiotic in case you get a vaginal yeast infection.   I will check your labs today and we can come up with a plan once we get these back.

## 2021-10-09 NOTE — Progress Notes (Signed)
Orma Render, DNP, AGNP-c [facility] [address]  [place], [place] [address] [phone]  Subjective:   Margaret [name] is a 36 y.o. female presents to day for a few different concerns. Fatigue She endorses extreme fatigue for about 2-3 weeks She did have some palpitations yesterday, but nothing routine She endorses that she feels she sleeps fairly well She does feel that she is more stressed right now  Binge Eating She endorses episodes of binge eating that she correlates with her mood She reports that she will not be hungry, but will eat large portions of food and then feel ill after She has had some nausea and one episode of vomiting, but none intentional  Terrible Mood Swings She tells me that she is easily irritated and has a hard time focusing on more than one thing at a time.  She tells me that when she has many things to do she will easily become irritated.  This has not happened in the past.  She tells me that she has not had any seizures recently but when she is very stressed out she will get a jerking sensation. She is on Lamictal for previous seizures Ear Pain She reports that she uses headphones for work consistently.  She tells me when she is wearing the headphones she has pressure in the ears and feels that the headphones are the cause.   PMH, Medications, and Allergies reviewed and updated in chart.   ROS negative except for what is listed in HPI. Objective:  BP 109/65   Pulse 84   Ht '5\' 8"'$  (1.727 m)   Wt 145 lb (65.8 kg)   SpO2 99%   BMI 22.05 kg/m  Physical Exam Vitals and nursing note reviewed.  Constitutional:      General: She is not in acute distress.    Appearance: Normal appearance.  HENT:     Head: Normocephalic.     Right Ear: Tenderness present. A middle ear effusion is present.     Left Ear: Tenderness present. A middle ear effusion is present.     Nose: No congestion.  Eyes:      Extraocular Movements: Extraocular movements intact.     Conjunctiva/sclera: Conjunctivae normal.     Pupils: Pupils are equal, round, and reactive to light.  Neck:     Vascular: No carotid bruit.  Cardiovascular:     Rate and Rhythm: Normal rate and regular rhythm.     Pulses: Normal pulses.     Heart sounds: Normal heart sounds. No murmur heard. Pulmonary:     Effort: Pulmonary effort is normal.     Breath sounds: Normal breath sounds. No wheezing.  Abdominal:     General: Bowel sounds are normal. There is no distension.     Palpations: Abdomen is soft.     Tenderness: There is no abdominal tenderness. There is no guarding.  Musculoskeletal:        General: Normal range of motion.     Cervical back: Normal range of motion and neck supple.     Right lower leg: No edema.     Left lower leg: No edema.  Lymphadenopathy:     Cervical: Cervical adenopathy present.  Skin:    General: Skin is warm and dry.     Capillary Refill: Capillary refill takes less than 2 seconds.  Neurological:     General: No focal deficit present.     Mental Status: She is  alert and oriented to person, place, and time.     Cranial Nerves: No cranial nerve deficit.     Sensory: No sensory deficit.     Motor: No weakness.     Coordination: Coordination normal.     Gait: Gait normal.  Psychiatric:        Mood and Affect: Mood normal.        Behavior: Behavior normal.        Thought Content: Thought content normal.        Judgment: Judgment normal.           Assessment & Plan:  1. Iron deficiency anemia, unspecified iron deficiency anemia type Labs from today's visit show IDA, which is likely strong contributor to her fatigue and palpitations. Recommend supplemental iron daily to work to get these levels up. Will plan to recheck in about 3 months.  - CBC With Diff/Platelet - Iron, TIBC and Ferritin Panel - ferrous sulfate 325 (65 FE) MG tablet; Take 1 tablet (325 mg total) by mouth daily with supper.   Dispense: 90 tablet; Refill: 3  2. Abnormal results of thyroid function studies TSH from today's visit shows hypothyroidism. TSH 6.210. She does have a history of elevated levels, but then (without medications) they normalized. I would like to recheck the labs one additional time in about 6 weeks to ensure that these have not normalized on their own. If levels remain elevated, plan to start levothyroxine. This is likely contributing to her symptoms.  - TSH  3. Vitamin D deficiency Labs from today's visit show low vitamin D levels which are likely also contributing to her symptoms. Will start high dose vitamin D and plan to follow-up in 3 months to recheck these levels.  - VITAMIN D 25 Hydroxy (Vit-D Deficiency, Fractures) - Vitamin D, Ergocalciferol, (DRISDOL) 1.25 MG (50000 UNIT) CAPS capsule; Take 1 capsule (50,000 Units total) by mouth every 7 (seven) days.  Dispense: 12 capsule; Refill: 3  4. Recurrent acute suppurative otitis media without spontaneous rupture of tympanic membrane of both sides Bilateral presence of effusion with erythema and tenderness noted to the ears. Headphone pressure is likely contributing to pain. Will treat today and monitor closely. She has had recurrent ear infections. We may need to consider ENT referral if these do not improve.  - CBC With Diff/Platelet - Comprehensive metabolic panel - Iron, TIBC and Ferritin Panel - amoxicillin-clavulanate (AUGMENTIN) 875-125 MG tablet; Take 1 tablet by mouth 2 (two) times daily.  Dispense: 20 tablet; Refill: 0 - fluconazole (DIFLUCAN) 150 MG tablet; Take one tablet by mouth at the first sign of symptoms of yeast. If no resolution, repeat dose in 72 hours.  Dispense: 2 tablet; Refill: 2   Orma Render, DNP, AGNP-c 11/05/2021  8:57 PM

## 2021-10-10 ENCOUNTER — Encounter (HOSPITAL_BASED_OUTPATIENT_CLINIC_OR_DEPARTMENT_OTHER): Payer: Self-pay | Admitting: Nurse Practitioner

## 2021-10-10 LAB — TSH: TSH: 6.21 u[IU]/mL — ABNORMAL HIGH (ref 0.450–4.500)

## 2021-10-10 LAB — COMPREHENSIVE METABOLIC PANEL
ALT: 92 IU/L — ABNORMAL HIGH (ref 0–32)
AST: 44 IU/L — ABNORMAL HIGH (ref 0–40)
Albumin/Globulin Ratio: 1.8 (ref 1.2–2.2)
Albumin: 4.6 g/dL (ref 3.9–4.9)
Alkaline Phosphatase: 59 IU/L (ref 44–121)
BUN/Creatinine Ratio: 20 (ref 9–23)
BUN: 11 mg/dL (ref 6–20)
Bilirubin Total: 0.2 mg/dL (ref 0.0–1.2)
CO2: 22 mmol/L (ref 20–29)
Calcium: 9.5 mg/dL (ref 8.7–10.2)
Chloride: 101 mmol/L (ref 96–106)
Creatinine, Ser: 0.56 mg/dL — ABNORMAL LOW (ref 0.57–1.00)
Globulin, Total: 2.5 g/dL (ref 1.5–4.5)
Glucose: 80 mg/dL (ref 70–99)
Potassium: 4.5 mmol/L (ref 3.5–5.2)
Sodium: 136 mmol/L (ref 134–144)
Total Protein: 7.1 g/dL (ref 6.0–8.5)
eGFR: 120 mL/min/{1.73_m2} (ref >59)

## 2021-10-10 LAB — IRON,TIBC AND FERRITIN PANEL
Ferritin: 8 ng/mL — ABNORMAL LOW (ref 15–150)
Iron Saturation: 9 % — CL (ref 15–55)
Iron: 44 ug/dL (ref 27–159)
Total Iron Binding Capacity: 486 ug/dL — ABNORMAL HIGH (ref 250–450)
UIBC: 442 ug/dL — ABNORMAL HIGH (ref 131–425)

## 2021-10-10 LAB — CBC WITH DIFF/PLATELET
Basophils Absolute: 0.1 10*3/uL (ref 0.0–0.2)
Basos: 1 %
EOS (ABSOLUTE): 0.2 10*3/uL (ref 0.0–0.4)
Eos: 3 %
Hematocrit: 34.3 % (ref 34.0–46.6)
Hemoglobin: 11 g/dL — ABNORMAL LOW (ref 11.1–15.9)
Immature Grans (Abs): 0 10*3/uL (ref 0.0–0.1)
Immature Granulocytes: 0 %
Lymphocytes Absolute: 2.4 10*3/uL (ref 0.7–3.1)
Lymphs: 35 %
MCH: 26.4 pg — ABNORMAL LOW (ref 26.6–33.0)
MCHC: 32.1 g/dL (ref 31.5–35.7)
MCV: 83 fL (ref 79–97)
Monocytes Absolute: 0.6 10*3/uL (ref 0.1–0.9)
Monocytes: 8 %
Neutrophils Absolute: 3.6 10*3/uL (ref 1.4–7.0)
Neutrophils: 53 %
Platelets: 173 10*3/uL (ref 150–450)
RBC: 4.16 x10E6/uL (ref 3.77–5.28)
RDW: 15.7 % — ABNORMAL HIGH (ref 11.7–15.4)
WBC: 6.8 10*3/uL (ref 3.4–10.8)

## 2021-10-10 LAB — VITAMIN D 25 HYDROXY (VIT D DEFICIENCY, FRACTURES): Vit D, 25-Hydroxy: 27.6 ng/mL — ABNORMAL LOW (ref 30.0–100.0)

## 2021-10-10 MED ORDER — FERROUS SULFATE 325 (65 FE) MG PO TABS
325.0000 mg | ORAL_TABLET | Freq: Every day | ORAL | 3 refills | Status: DC
  Filled 2021-10-10: qty 100, 100d supply, fill #0

## 2021-10-10 MED ORDER — VITAMIN D (ERGOCALCIFEROL) 1.25 MG (50000 UNIT) PO CAPS
50000.0000 [IU] | ORAL_CAPSULE | ORAL | 3 refills | Status: DC
  Filled 2021-10-10: qty 12, 84d supply, fill #0

## 2021-10-11 ENCOUNTER — Encounter (HOSPITAL_BASED_OUTPATIENT_CLINIC_OR_DEPARTMENT_OTHER): Payer: Self-pay | Admitting: Physical Therapy

## 2021-10-11 ENCOUNTER — Telehealth (HOSPITAL_BASED_OUTPATIENT_CLINIC_OR_DEPARTMENT_OTHER): Payer: Self-pay | Admitting: Nurse Practitioner

## 2021-10-11 ENCOUNTER — Other Ambulatory Visit (HOSPITAL_BASED_OUTPATIENT_CLINIC_OR_DEPARTMENT_OTHER): Payer: Self-pay

## 2021-10-11 NOTE — Telephone Encounter (Signed)
Paper work -recd by Patient  [facility] Treatment Report Form  Placed in the RED folder

## 2021-10-11 NOTE — Telephone Encounter (Signed)
Patient requires appt to discuss concerns.

## 2021-10-11 NOTE — Telephone Encounter (Signed)
Called pt and LVM on 8/29 @ 9:16 to sch a same day appt we have available. Let pt know that this is a first come first serve appt so if she is wanting it to call back to sch.

## 2021-11-16 ENCOUNTER — Ambulatory Visit (HOSPITAL_BASED_OUTPATIENT_CLINIC_OR_DEPARTMENT_OTHER): Payer: Medicaid Other | Admitting: Nurse Practitioner

## 2021-11-16 ENCOUNTER — Encounter (HOSPITAL_BASED_OUTPATIENT_CLINIC_OR_DEPARTMENT_OTHER): Payer: Self-pay | Admitting: Nurse Practitioner

## 2021-11-16 ENCOUNTER — Other Ambulatory Visit (HOSPITAL_BASED_OUTPATIENT_CLINIC_OR_DEPARTMENT_OTHER): Payer: Self-pay

## 2021-11-16 ENCOUNTER — Encounter (HOSPITAL_BASED_OUTPATIENT_CLINIC_OR_DEPARTMENT_OTHER): Payer: Self-pay

## 2021-11-16 DIAGNOSIS — D509 Iron deficiency anemia, unspecified: Secondary | ICD-10-CM

## 2021-11-16 DIAGNOSIS — F419 Anxiety disorder, unspecified: Secondary | ICD-10-CM

## 2021-11-16 DIAGNOSIS — F32A Depression, unspecified: Secondary | ICD-10-CM

## 2021-11-16 DIAGNOSIS — E538 Deficiency of other specified B group vitamins: Secondary | ICD-10-CM

## 2021-11-16 DIAGNOSIS — E038 Other specified hypothyroidism: Secondary | ICD-10-CM

## 2021-11-16 DIAGNOSIS — E559 Vitamin D deficiency, unspecified: Secondary | ICD-10-CM

## 2021-11-16 NOTE — Patient Instructions (Signed)
I will be in touch with you about your lab results once these have come back and I have had a chance to review them.  If we need to make changes to your medications, I will let you know.   We can plan to follow-up in 6 months unless your labs show concern or you need to see me sooner.

## 2021-11-16 NOTE — Progress Notes (Signed)
Scheduling error. Patient only needs repeat labs today.

## 2021-11-16 NOTE — Assessment & Plan Note (Signed)
Thyroid recheck today. Exam WNL. Labs pending.  Her symptoms are           from her last visit. We will plan to           her current regimen of levothyroxine.  Will make changes to plan of care based on lab findings.

## 2021-11-16 NOTE — Assessment & Plan Note (Signed)
Repeat labs today. No current symptoms

## 2021-11-16 NOTE — Assessment & Plan Note (Signed)
Chronic. Labs pending today.  Exam benign with no alarm symptoms present at this time. No signs of bleeding.  Continue iron replacement for now.  Will make changes to plan of care based on labs as appropriate

## 2021-11-16 NOTE — Assessment & Plan Note (Signed)
Chronic. Not currently on any medication at this time.  Her symptoms are  No alarm symptoms present.  Continue with counseling and monitor for new or worsening symptoms.  F/U in 6 months or sooner if needed.

## 2021-11-17 LAB — CBC WITH DIFFERENTIAL/PLATELET
Basophils Absolute: 0.1 10*3/uL (ref 0.0–0.2)
Basos: 1 %
EOS (ABSOLUTE): 0.2 10*3/uL (ref 0.0–0.4)
Eos: 3 %
Hematocrit: 39.9 % (ref 34.0–46.6)
Hemoglobin: 12.9 g/dL (ref 11.1–15.9)
Immature Grans (Abs): 0 10*3/uL (ref 0.0–0.1)
Immature Granulocytes: 0 %
Lymphocytes Absolute: 1.8 10*3/uL (ref 0.7–3.1)
Lymphs: 37 %
MCH: 28.5 pg (ref 26.6–33.0)
MCHC: 32.3 g/dL (ref 31.5–35.7)
MCV: 88 fL (ref 79–97)
Monocytes Absolute: 0.5 10*3/uL (ref 0.1–0.9)
Monocytes: 10 %
Neutrophils Absolute: 2.3 10*3/uL (ref 1.4–7.0)
Neutrophils: 49 %
Platelets: 240 10*3/uL (ref 150–450)
RBC: 4.52 x10E6/uL (ref 3.77–5.28)
RDW: 17.5 % — ABNORMAL HIGH (ref 11.7–15.4)
WBC: 4.8 10*3/uL (ref 3.4–10.8)

## 2021-11-17 LAB — COMPREHENSIVE METABOLIC PANEL
ALT: 23 IU/L (ref 0–32)
AST: 16 IU/L (ref 0–40)
Albumin/Globulin Ratio: 1.9 (ref 1.2–2.2)
Albumin: 4.6 g/dL (ref 3.9–4.9)
Alkaline Phosphatase: 50 IU/L (ref 44–121)
BUN/Creatinine Ratio: 18 (ref 9–23)
BUN: 11 mg/dL (ref 6–20)
Bilirubin Total: <0.2 mg/dL (ref 0.0–1.2)
CO2: 22 mmol/L (ref 20–29)
Calcium: 9.3 mg/dL (ref 8.7–10.2)
Chloride: 103 mmol/L (ref 96–106)
Creatinine, Ser: 0.62 mg/dL (ref 0.57–1.00)
Globulin, Total: 2.4 g/dL (ref 1.5–4.5)
Glucose: 83 mg/dL (ref 70–99)
Potassium: 4.2 mmol/L (ref 3.5–5.2)
Sodium: 138 mmol/L (ref 134–144)
Total Protein: 7 g/dL (ref 6.0–8.5)
eGFR: 117 mL/min/{1.73_m2} (ref >59)

## 2021-11-17 LAB — B12 AND FOLATE PANEL
Folate: 6.6 ng/mL (ref >3.0)
Vitamin B-12: 662 pg/mL (ref 232–1245)

## 2021-11-17 LAB — IRON,TIBC AND FERRITIN PANEL
Ferritin: 19 ng/mL (ref 15–150)
Iron Saturation: 17 % (ref 15–55)
Iron: 66 ug/dL (ref 27–159)
Total Iron Binding Capacity: 379 ug/dL (ref 250–450)
UIBC: 313 ug/dL (ref 131–425)

## 2021-11-17 LAB — VITAMIN D 25 HYDROXY (VIT D DEFICIENCY, FRACTURES): Vit D, 25-Hydroxy: 37.2 ng/mL (ref 30.0–100.0)

## 2021-11-17 LAB — TSH: TSH: 6.66 u[IU]/mL — ABNORMAL HIGH (ref 0.450–4.500)

## 2021-11-17 LAB — T4, FREE: Free T4: 1.21 ng/dL (ref 0.82–1.77)

## 2021-11-21 ENCOUNTER — Encounter (HOSPITAL_BASED_OUTPATIENT_CLINIC_OR_DEPARTMENT_OTHER): Payer: Self-pay | Admitting: Nurse Practitioner

## 2021-11-21 DIAGNOSIS — E038 Other specified hypothyroidism: Secondary | ICD-10-CM

## 2021-11-22 MED ORDER — LEVOTHYROXINE SODIUM 25 MCG PO TABS: 25.0000 ug | ORAL_TABLET | Freq: Every day | ORAL | 3 refills | Status: DC

## 2021-12-18 ENCOUNTER — Encounter (HOSPITAL_BASED_OUTPATIENT_CLINIC_OR_DEPARTMENT_OTHER): Payer: Self-pay | Admitting: Nurse Practitioner

## 2021-12-19 NOTE — Telephone Encounter (Signed)
Please ask patient if they have a specific form that needs to be completed or if we need to just write a letter.

## 2022-01-08 ENCOUNTER — Telehealth: Payer: Self-pay
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
## 2022-01-15 ENCOUNTER — Ambulatory Visit (INDEPENDENT_AMBULATORY_CARE_PROVIDER_SITE_OTHER): Payer: Medicaid Other | Admitting: Nurse Practitioner

## 2022-01-15 ENCOUNTER — Encounter: Payer: Self-pay | Admitting: Nurse Practitioner

## 2022-01-15 VITALS — BP 124/80 | HR 95 | Temp 98.5°F | Wt 154.4 lb

## 2022-01-15 DIAGNOSIS — G43019 Migraine without aura, intractable, without status migrainosus: Secondary | ICD-10-CM | POA: Diagnosis not present

## 2022-01-15 DIAGNOSIS — G40909 Epilepsy, unspecified, not intractable, without status epilepticus: Secondary | ICD-10-CM | POA: Diagnosis not present

## 2022-01-15 DIAGNOSIS — F419 Anxiety disorder, unspecified: Secondary | ICD-10-CM | POA: Diagnosis not present

## 2022-01-15 DIAGNOSIS — E039 Hypothyroidism, unspecified: Secondary | ICD-10-CM

## 2022-01-15 DIAGNOSIS — E038 Other specified hypothyroidism: Secondary | ICD-10-CM

## 2022-01-15 DIAGNOSIS — F32A Depression, unspecified: Secondary | ICD-10-CM

## 2022-01-15 MED ORDER — IBUPROFEN 600 MG PO TABS: 600.0000 mg | ORAL_TABLET | Freq: Three times a day (TID) | ORAL | 6 refills | Status: DC | PRN

## 2022-01-15 NOTE — Progress Notes (Signed)
Worthy Keeler, DNP, AGNP-c [place]  [address] [place], [place] [address] [phone]  ESTABLISHED PATIENT- Chronic Health and/or Follow-Up Visit  Blood pressure 124/80, pulse 95, temperature 98.5 F (36.9 C), weight 154 lb 6.4 oz (70 kg).    Margaret [name] is a 36 y.o. year old female presenting today for evaluation and management of the following: Headaches Margaret [name] endorses concerns with ongoing headaches. She endorses significant anxiety and stress due to her current job which have triggered the onset of headaches. She is required to wear a headset and she is on the phone the majority of the day. 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 She tells me the work is tedious and stressful and it is wearing on her overall health. She tells me there is constant client interaction with phone calls and emails and she rarely is not on the computer. She has the history of seizures and she is very concerned that her seizures are going to come back related to the stress. She has tried for [employer] from the company, but this was denied. She feels that if she could decrease the hours to part time she would better be able to manage the requirements of the job and protect her overall health. She tells me she is to the point that she may have to leave the company to look for something else. She endorses blurred vision, headaches behind the eyes, nausea, sensitivity to light and sound, fatigue, and neck pain by the end of the work day every day. She currently has a headache that has been present since last Thursday.   She is also due for her thyroid to be rechecked today.   All ROS negative with exception of what is listed above.   PHYSICAL EXAM Physical Exam Vitals and nursing note reviewed.  Constitutional:      Appearance: She is ill-appearing.  HENT:     Head: Normocephalic.     Right Ear: Tympanic membrane normal.      Left Ear: Tympanic membrane normal.     Mouth/Throat:     Mouth: Mucous membranes are moist.  Eyes:     General: Lids are normal. Gaze aligned appropriately. No visual field deficit.    Extraocular Movements: Extraocular movements intact.     Right eye: No nystagmus.     Left eye: No nystagmus.     Conjunctiva/sclera: Conjunctivae normal.     Pupils: Pupils are equal, round, and reactive to light.  Cardiovascular:     Rate and Rhythm: Normal rate and regular rhythm.     Pulses: Normal pulses.     Heart sounds: Normal heart sounds.  Pulmonary:     Effort: Pulmonary effort is normal.     Breath sounds: Normal breath sounds.  Musculoskeletal:     Cervical back: Tenderness present. Pain with movement and muscular tenderness present. Decreased range of motion.  Lymphadenopathy:     Cervical: No cervical adenopathy.  Skin:    General: Skin is warm and dry.     Capillary Refill: Capillary refill takes less than 2 seconds.  Neurological:     General: No focal deficit present.     Mental Status: She is alert and oriented to person, place, and time.     Cranial Nerves: No cranial nerve deficit.     Sensory: No sensory deficit.     Motor: No weakness.  Psychiatric:  Mood and Affect: Mood normal.     PLAN Problem List Items Addressed This Visit     Seizure disorder ([place])    History of seizure disorder with current concerns that anxiety and stress as well as ear and eyestrain will trigger repeat occurrences of seizures by patient.  She is not having any seizure-like symptoms at this time which is reassuring however I do have concern for the symptoms that she is experiencing and cannot guarantee that seizures would not be triggered.  I do feel we need to work to help reduce her stress level and better control her headaches in order to reduce these risks.  We did discuss the option of tapering down to part-time today and she is interested in this.  She will speak to her supervisors to  determine if this is an option.  Alternatively we can resubmit FMLA paperwork to see if there is an option for reduced hours related to her migraine headaches.  She does not have any alarm symptoms present today and she has not had any recent seizure activity which is reassuring.      Anxiety and depression    Significant anxiety related to stressors on the job and associated health related symptoms that have come up since her stress is increased.  Discussion with ways to help reduce stress and anxiety with patient today.  We also discussed the option of reducing work hours to see if this is helpful for improved management.  She will check to see if there is an option for reduced schedule or if FMLA paperwork can be completed to permit a reduced schedule for at least a temporary period of time to help get her stress levels and headaches under better control.      RESOLVED: Subclinical hypothyroidism   Intractable migraine without aura and without status migrainosus - Primary    Chronic migraine not well-controlled at this time.  Symptoms do seem to exacerbate with increased stress at work.  Unfortunately she has been unable to get FMLA in the recent past for her migraine headaches.  It is unclear why this was denied.  We did discuss the option today of bumping down to part-time which she is interested in doing.  We can complete FMLA paperwork if she would like to request part-time hours to see if this is helpful in reducing the frequency and intensity of migraine headaches she is experiencing.  Patient will check with her employer and let us know.      Relevant Medications   ibuprofen (ADVIL) 600 MG tablet   Acquired hypothyroidism    Will plan to recheck labs today.  No alarm symptoms are present at this time.  Thyroid size stable.  Continue current medication regimen until labs have come back.  If changes need to be made to dosage I will update this once we have results.      Relevant Orders    TSH (Completed)   T4, free (Completed)    Return in about 4 weeks (around 02/12/2022) for Stress/Work.   Worthy Keeler, DNP, AGNP-c 01/15/2022  1:48 PM

## 2022-01-15 NOTE — Patient Instructions (Addendum)
We will work to see if we can get FMLA to allow for accommodations for you to have shorter hours.   I would like you to take the week off to think about your options and allow your mind to rest.    We can consider a medication called Lexapro that may be helpful to keep your mind stable. I would like you to read about this medicine. I will put the information on here for you.

## 2022-01-16 LAB — T4, FREE: Free T4: 1.41 ng/dL (ref 0.82–1.77)

## 2022-01-16 LAB — TSH: TSH: 4.48 u[IU]/mL (ref 0.450–4.500)

## 2022-01-31 DIAGNOSIS — G43019 Migraine without aura, intractable, without status migrainosus: Secondary | ICD-10-CM | POA: Insufficient documentation

## 2022-01-31 DIAGNOSIS — E039 Hypothyroidism, unspecified: Secondary | ICD-10-CM | POA: Insufficient documentation

## 2022-01-31 NOTE — Assessment & Plan Note (Deleted)
Will plan to recheck labs today.  No alarm symptoms are present at this time.  Thyroid size stable.  Continue current medication regimen until labs have come back.  If changes need to be made to dosage I will update this once we have results.

## 2022-01-31 NOTE — Assessment & Plan Note (Signed)
Will plan to recheck labs today.  No alarm symptoms are present at this time.  Thyroid size stable.  Continue current medication regimen until labs have come back.  If changes need to be made to dosage I will update this once we have results.

## 2022-01-31 NOTE — Assessment & Plan Note (Addendum)
History of seizure disorder with current concerns that anxiety and stress as well as ear and eyestrain will trigger repeat occurrences of seizures by patient.  She is not having any seizure-like symptoms at this time which is reassuring however I do have concern for the symptoms that she is experiencing and cannot guarantee that seizures would not be triggered.  I do feel we need to work to help reduce her stress level and better control her headaches in order to reduce these risks.  We did discuss the option of tapering down to part-time today and she is interested in this.  She will speak to her supervisors to determine if this is an option.  Alternatively we can resubmit FMLA paperwork to see if there is an option for reduced hours related to her migraine headaches.  She does not have any alarm symptoms present today and she has not had any recent seizure activity which is reassuring.

## 2022-01-31 NOTE — Assessment & Plan Note (Signed)
Chronic migraine not well-controlled at this time.  Symptoms do seem to exacerbate with increased stress at work.  Unfortunately she has been unable to get FMLA in the recent past for her migraine headaches.  It is unclear why this was denied.  We did discuss the option today of bumping down to part-time which she is interested in doing.  We can complete FMLA paperwork if she would like to request part-time hours to see if this is helpful in reducing the frequency and intensity of migraine headaches she is experiencing.  Patient will check with her employer and let us know.

## 2022-01-31 NOTE — Assessment & Plan Note (Signed)
Significant anxiety related to stressors on the job and associated health related symptoms that have come up since her stress is increased.  Discussion with ways to help reduce stress and anxiety with patient today.  We also discussed the option of reducing work hours to see if this is helpful for improved management.  She will check to see if there is an option for reduced schedule or if FMLA paperwork can be completed to permit a reduced schedule for at least a temporary period of time to help get her stress levels and headaches under better control.

## 2022-02-15 ENCOUNTER — Ambulatory Visit (INDEPENDENT_AMBULATORY_CARE_PROVIDER_SITE_OTHER): Payer: Medicaid Other | Admitting: Nurse Practitioner

## 2022-02-15 ENCOUNTER — Encounter: Payer: Self-pay | Admitting: Nurse Practitioner

## 2022-02-15 VITALS — BP 120/80 | HR 77 | Temp 98.5°F | Wt 150.4 lb

## 2022-02-15 DIAGNOSIS — G40909 Epilepsy, unspecified, not intractable, without status epilepticus: Secondary | ICD-10-CM | POA: Diagnosis not present

## 2022-02-15 DIAGNOSIS — G43019 Migraine without aura, intractable, without status migrainosus: Secondary | ICD-10-CM | POA: Diagnosis not present

## 2022-02-15 DIAGNOSIS — F32A Depression, unspecified: Secondary | ICD-10-CM

## 2022-02-15 DIAGNOSIS — F411 Generalized anxiety disorder: Secondary | ICD-10-CM

## 2022-02-15 DIAGNOSIS — F419 Anxiety disorder, unspecified: Secondary | ICD-10-CM

## 2022-02-15 MED ORDER — PROPRANOLOL HCL 20 MG PO TABS: 20.0000 mg | ORAL_TABLET | Freq: Three times a day (TID) | ORAL | 6 refills | Status: DC | PRN

## 2022-02-15 NOTE — Progress Notes (Signed)
Worthy Keeler, DNP, AGNP-c [facility]  [address] [place], [place] [address] [phone]  ESTABLISHED PATIENT- Chronic Health and/or Follow-Up Visit  Blood pressure 120/80, pulse 77, temperature 98.5 F (36.9 C), weight 150 lb 6.4 oz (68.2 kg), last menstrual period 02/06/2022.    Margaret [name] is a 37 y.o. year old female presenting today for evaluation and management of the following: FMLA Paperwork Lareina has a longstanding history of seizure disorder and migraine headaches. 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 She reports that since starting this position she has had a significant increase in migraine headaches, stress, fatigue, and mental exhaustion. These symptoms directly correlate with her work environment and requirements. She is concerned for an increase in seizures with the other symptoms she is experiencing as these were the precursors for previous seizures. She would like to obtain FMLA to help reduce her work hours required to help reduce her symptoms and (hopefully) prevent the onset of new seizures.   All ROS negative with exception of what is listed above.   PHYSICAL EXAM Physical Exam Vitals and nursing note reviewed.  Constitutional:      General: She is not in acute distress.    Appearance: Normal appearance.     Comments: Patient appears fatigued  HENT:     Head: Normocephalic.  Eyes:     Extraocular Movements: Extraocular movements intact.     Conjunctiva/sclera: Conjunctivae normal.     Pupils: Pupils are equal, round, and reactive to light.  Neck:     Vascular: No carotid bruit.  Cardiovascular:     Rate and Rhythm: Normal rate and regular rhythm.     Pulses: Normal pulses.     Heart sounds: Normal heart sounds. No murmur heard. Pulmonary:     Effort: Pulmonary effort is normal.      Breath sounds: Normal breath sounds. No wheezing.  Musculoskeletal:        General: Normal range of motion.     Cervical back: Normal range of motion and neck supple.     Right lower leg: No edema.     Left lower leg: No edema.  Lymphadenopathy:     Cervical: No cervical adenopathy.  Skin:    General: Skin is warm and dry.     Capillary Refill: Capillary refill takes less than 2 seconds.  Neurological:     General: No focal deficit present.     Mental Status: She is alert and oriented to person, place, and time.     Sensory: No sensory deficit.     Motor: No weakness.     Coordination: Coordination normal.  Psychiatric:        Attention and Perception: Attention normal.        Mood and Affect: Mood is depressed. Affect is tearful.        Speech: Speech normal.        Behavior: Behavior normal. Behavior is cooperative.        Thought Content: Thought content normal.        Judgment: Judgment normal.     PLAN Problem List Items Addressed This Visit     Seizure disorder ([place]) - Primary    FMLA paperwork completed on behalf of the patient today requesting modified work schedule to reduce the stressors and stimuli to help lessen the risk of  ongoing seizures. No alarm sx are present at this time. Recommend close monitoring and continuation of current medication. Report any seizure like activity immediately and seek emergency care.       Relevant Medications   lamoTRIgine (LAMICTAL) 150 MG tablet   Anxiety and depression    Increased anxiety and depressive symptoms in the setting of stressors from employment. She has not tolerated SSRI medication, but is doing well with propranolol. Will continue to monitor. Recommend reduced work schedule to help reduce stress levels to protect overall physical and mental health.       Relevant Medications   propranolol (INDERAL) 20 MG tablet   Intractable migraine without aura and without status migrainosus    Chronic intractable migraine  headaches on weekly basis with increased levels of stress and visual and auditory stimulation from work environment. Propranolol in place for preventative treatment not completely effective, however, given that her triggers remain daily we may not be able to get good control until these decrease as well. Recommend avoiding headphone usage and trial of open speaker/microphone to help reduce the pressure on the ears and lower the stimulation. Will monitor.       Relevant Medications   lamoTRIgine (LAMICTAL) 150 MG tablet   propranolol (INDERAL) 20 MG tablet    No follow-ups on file.   Worthy Keeler, DNP, AGNP-c 02/15/2022  2:09 PM

## 2022-02-19 NOTE — Assessment & Plan Note (Signed)
Increased anxiety and depressive symptoms in the setting of stressors from employment. She has not tolerated SSRI medication, but is doing well with propranolol. Will continue to monitor. Recommend reduced work schedule to help reduce stress levels to protect overall physical and mental health.

## 2022-02-19 NOTE — Assessment & Plan Note (Signed)
FMLA paperwork completed on behalf of the patient today requesting modified work schedule to reduce the stressors and stimuli to help lessen the risk of ongoing seizures. No alarm sx are present at this time. Recommend close monitoring and continuation of current medication. Report any seizure like activity immediately and seek emergency care.

## 2022-02-19 NOTE — Assessment & Plan Note (Signed)
Chronic intractable migraine headaches on weekly basis with increased levels of stress and visual and auditory stimulation from work environment. Propranolol in place for preventative treatment not completely effective, however, given that her triggers remain daily we may not be able to get good control until these decrease as well. Recommend avoiding headphone usage and trial of open speaker/microphone to help reduce the pressure on the ears and lower the stimulation. Will monitor.

## 2022-03-28 ENCOUNTER — Ambulatory Visit: Payer: Medicaid Other | Admitting: Nurse Practitioner

## 2022-03-28 NOTE — Progress Notes (Incomplete)
  Orma Render, DNP, AGNP-c [facility] [address] [place], [place] [address] [phone]  Subjective:   Margaret [name] is a 37 y.o. female presents to day for evaluation of:   PMH, Medications, and Allergies reviewed and updated in chart as appropriate.   ROS negative except for what is listed in HPI. Objective:  There were no vitals taken for this visit. Physical Exam        Assessment & Plan:   Problem List Items Addressed This Visit   None     Orma Render, DNP, AGNP-c 03/28/2022  7:58 AM    History, Medications, Surgery, SDOH, and Family History reviewed and updated as appropriate.

## 2022-03-29 ENCOUNTER — Ambulatory Visit (INDEPENDENT_AMBULATORY_CARE_PROVIDER_SITE_OTHER): Payer: Medicaid Other | Admitting: Nurse Practitioner

## 2022-03-29 ENCOUNTER — Encounter: Payer: Self-pay | Admitting: Nurse Practitioner

## 2022-03-29 VITALS — BP 110/68 | HR 83 | Wt 154.6 lb

## 2022-03-29 DIAGNOSIS — R1013 Epigastric pain: Secondary | ICD-10-CM | POA: Diagnosis not present

## 2022-03-29 DIAGNOSIS — R1012 Left upper quadrant pain: Secondary | ICD-10-CM | POA: Insufficient documentation

## 2022-03-29 DIAGNOSIS — E559 Vitamin D deficiency, unspecified: Secondary | ICD-10-CM

## 2022-03-29 HISTORY — DX: Left upper quadrant pain: R10.12

## 2022-03-29 HISTORY — DX: Epigastric pain: R10.13

## 2022-03-29 MED ORDER — OMEPRAZOLE 40 MG PO CPDR: 40.0000 mg | DELAYED_RELEASE_CAPSULE | Freq: Two times a day (BID) | ORAL | 0 refills | Status: DC

## 2022-03-29 MED ORDER — VITAMIN D (ERGOCALCIFEROL) 1.25 MG (50000 UNIT) PO CAPS: 50000.0000 [IU] | ORAL_CAPSULE | ORAL | 3 refills | Status: DC

## 2022-03-29 MED ORDER — OXYCODONE-ACETAMINOPHEN 5-325 MG PO TABS: 1.0000 | ORAL_TABLET | Freq: Four times a day (QID) | ORAL | 0 refills | Status: DC | PRN

## 2022-03-29 MED ORDER — SUCRALFATE 1 G PO TABS: 1.0000 g | ORAL_TABLET | Freq: Three times a day (TID) | ORAL | 0 refills | Status: DC

## 2022-03-29 NOTE — Assessment & Plan Note (Addendum)
Patient presents with repeated bouts of epigastric abdominal pain, raising concerns for possible pancreatitis. Her pain reportedly radiates to the left upper abdomen and occasionally into the chest. She has had pancreatitis in the past and it felt similar. No acute abdominal symptoms present on exam. Epigastric region is tender. I am also concerned with possible gastric ulcer given her symptoms and recent increased stress.  Plan: - Order diagnostic labs including amylase, lipase, liver function tests, and complete blood count (CBC) to evaluate for pancreatitis and potential infection. - Advise a clear liquid diet initially, progressing to bland foods like applesauce if symptoms improve. - Educate the patient on pancreatitis, highlighting potential triggers and dietary guidelines. - Will start omeprazole and carafate for short term to see if this helps.  - Pain medication provided for severe pain - Ultrasound ordered- may cancel this if the labs are revealing.

## 2022-03-29 NOTE — Progress Notes (Signed)
Orma Render, DNP, AGNP-c [place], [place] [address] [phone]  Subjective:   Katelan Kipper is a 37 y.o. female presents to day for evaluation of: Abdominal Pain The patient presents today with concerns regarding persistent abdominal pain, specifically located in the epigastric region and on the left side, which has been ongoing for the past three weeks. She describes the pain as extremely painful, with episodes lasting up to three days before any improvement is noted. She reports sensations of trapped gas and notes that the pain radiates to other areas of the abdomen. While she denies any nausea or vomiting, she does report experiencing constipation and mentions that the pain continues even after bowel movements.  Her medical history includes an episode of pancreatitis two to three years ago, during which she had elevated lipase levels. She clarifies that alcohol consumption was not a factor in her previous condition. The patient has identified that the consumption of certain foods, such as cheese and items containing preservatives, appears to worsen her symptoms. Additionally, she indicates that she engaged in binge eating before the onset of her current symptoms. She tells me that despite the pain she is unusually hungry.   The patient denies having a fever but does report having had a recent cold and sore throat. She has no history of gallstones or gallbladder issues. Currently, she mentions that she is out of vitamin D supplements.   PMH, Medications, and Allergies reviewed and updated in chart as appropriate.   ROS negative except for what is listed in HPI. Objective:  BP 110/68   Pulse 83   Wt 154 lb 9.6 oz (70.1 kg)   BMI 23.51 kg/m  Physical Exam Vitals and nursing note reviewed.  Constitutional:      Appearance: She is well-developed. She is not toxic-appearing or diaphoretic.  HENT:     Head: Normocephalic.  Eyes:      Extraocular Movements: Extraocular movements intact.  Cardiovascular:     Rate and Rhythm: Normal rate and regular rhythm.     Heart sounds: Normal heart sounds.  Pulmonary:     Effort: Pulmonary effort is normal.     Breath sounds: Normal breath sounds.  Abdominal:     General: Abdomen is flat. Bowel sounds are increased. There is no distension or abdominal bruit. There are no signs of injury.     Palpations: Abdomen is soft. There is no shifting dullness, fluid wave or mass.     Tenderness: There is abdominal tenderness in the epigastric area. There is no right CVA tenderness or left CVA tenderness.     Hernia: No hernia is present.  Skin:    General: Skin is warm and dry.     Capillary Refill: Capillary refill takes less than 2 seconds.  Neurological:     General: No focal deficit present.     Mental Status: She is alert.  Psychiatric:        Mood and Affect: Mood normal.        Behavior: Behavior normal.           Assessment & Plan:   Problem List Items Addressed This Visit     Vitamin D deficiency    Repeat labs today and refills provided. Chronic deficiency.       Relevant Medications   Vitamin D, Ergocalciferol, (DRISDOL) 1.25 MG (50000 UNIT) CAPS capsule   Left upper quadrant abdominal pain   Relevant Medications   oxyCODONE-acetaminophen (PERCOCET) 5-325 MG tablet  Other Relevant Orders   CBC with Differential/Platelet   Comprehensive metabolic panel   Lipase   Amylase   US Abdomen Limited   Recurrent epigastric abdominal pain - Primary    Patient presents with repeated bouts of epigastric abdominal pain, raising concerns for possible pancreatitis. Her pain reportedly radiates to the left upper abdomen and occasionally into the chest. She has had pancreatitis in the past and it felt similar. No acute abdominal symptoms present on exam. Epigastric region is tender. I am also concerned with possible gastric ulcer given her symptoms and recent increased stress.   Plan: - Order diagnostic labs including amylase, lipase, liver function tests, and complete blood count (CBC) to evaluate for pancreatitis and potential infection. - Advise a clear liquid diet initially, progressing to bland foods like applesauce if symptoms improve. - Educate the patient on pancreatitis, highlighting potential triggers and dietary guidelines. - Will start omeprazole and carafate for short term to see if this helps.  - Pain medication provided for severe pain - Ultrasound ordered- may cancel this if the labs are revealing.       Relevant Medications   omeprazole (PRILOSEC) 40 MG capsule   sucralfate (CARAFATE) 1 g tablet   oxyCODONE-acetaminophen (PERCOCET) 5-325 MG tablet   Other Relevant Orders   CBC with Differential/Platelet   Comprehensive metabolic panel   Lipase   Amylase   US Abdomen Limited      Orma Render, DNP, AGNP-c 03/29/2022  7:03 PM    History, Medications, Surgery, SDOH, and Family History reviewed and updated as appropriate.

## 2022-03-29 NOTE — Assessment & Plan Note (Signed)
Repeat labs today and refills provided. Chronic deficiency.

## 2022-03-29 NOTE — Patient Instructions (Addendum)
Following our discussion, please find below the important instructions and subsequent steps for your continued care:  1. Undergo laboratory tests to evaluate pancreas enzymes (lipase and amylase) and liver function, and to screen for signs of infection.  2. An ultrasound of the abdomen is scheduled to assess your gallbladder and surrounding structures; this may be subject to cancellation based on the laboratory findings.  3. Pending insurance approval, medication may be prescribed to alleviate stomach acid and soothe the gastrointestinal tract.  4. Dietary recommendations to follow:    - Opt for a clear liquid diet during symptomatic periods, which can include items like broth and jello.    - If symptoms subside, transition to bland foods such as applesauce.    Janne Napoleon clear of fried and fatty foods, as well as those containing preservatives.    - Cheese consumption should be limited or avoided to prevent symptom onset.    - Keep track of and avoid foods that trigger acid reflux, including coffee and donuts.  5. Lifestyle modifications to consider:    - Avoid overeating; instead, aim for smaller, more frequent meals.    - Fasting should only be considered if it does not aggravate hunger or discomfort.  6. You have been provided with educational materials regarding pancreatitis, identifying potential dietary triggers, and strategies for peptic ulcer prevention.  7. Regarding Vitamin D: Please contact us if you need a new prescription or have any concerns about your Vitamin D levels.  8. I have sent pain medication in for you to take if you are having severe pain.   9. I have included a note to allow you to continue with part time work.   We ask that you keep [place] informed about any changes in your symptoms. Should you have any questions or need further assistance, please do not hesitate to get in touch with our office. We will evaluate your lab results upon their availability to determine if  additional measures are necessary.

## 2022-03-30 LAB — CBC WITH DIFFERENTIAL/PLATELET
Basophils Absolute: 0.1 10*3/uL (ref 0.0–0.2)
Basos: 1 %
EOS (ABSOLUTE): 0.2 10*3/uL (ref 0.0–0.4)
Eos: 4 %
Hematocrit: 35.5 % (ref 34.0–46.6)
Hemoglobin: 11.6 g/dL (ref 11.1–15.9)
Immature Grans (Abs): 0 10*3/uL (ref 0.0–0.1)
Immature Granulocytes: 0 %
Lymphocytes Absolute: 1.8 10*3/uL (ref 0.7–3.1)
Lymphs: 35 %
MCH: 29.1 pg (ref 26.6–33.0)
MCHC: 32.7 g/dL (ref 31.5–35.7)
MCV: 89 fL (ref 79–97)
Monocytes Absolute: 0.3 10*3/uL (ref 0.1–0.9)
Monocytes: 6 %
Neutrophils Absolute: 2.8 10*3/uL (ref 1.4–7.0)
Neutrophils: 54 %
Platelets: 254 10*3/uL (ref 150–450)
RBC: 3.99 x10E6/uL (ref 3.77–5.28)
RDW: 12.1 % (ref 11.7–15.4)
WBC: 5.1 10*3/uL (ref 3.4–10.8)

## 2022-03-30 LAB — COMPREHENSIVE METABOLIC PANEL
ALT: 23 IU/L (ref 0–32)
AST: 20 IU/L (ref 0–40)
Albumin/Globulin Ratio: 1.7 (ref 1.2–2.2)
Albumin: 4.4 g/dL (ref 3.9–4.9)
Alkaline Phosphatase: 52 IU/L (ref 44–121)
BUN/Creatinine Ratio: 20 (ref 9–23)
BUN: 12 mg/dL (ref 6–20)
Bilirubin Total: <0.2 mg/dL (ref 0.0–1.2)
CO2: 22 mmol/L (ref 20–29)
Calcium: 8.9 mg/dL (ref 8.7–10.2)
Chloride: 102 mmol/L (ref 96–106)
Creatinine, Ser: 0.59 mg/dL (ref 0.57–1.00)
Globulin, Total: 2.6 g/dL (ref 1.5–4.5)
Glucose: 103 mg/dL — ABNORMAL HIGH (ref 70–99)
Potassium: 4.1 mmol/L (ref 3.5–5.2)
Sodium: 139 mmol/L (ref 134–144)
Total Protein: 7 g/dL (ref 6.0–8.5)
eGFR: 117 mL/min/{1.73_m2} (ref >59)

## 2022-03-30 LAB — AMYLASE: Amylase: 62 U/L (ref 31–110)

## 2022-03-30 LAB — LIPASE: Lipase: 81 U/L — ABNORMAL HIGH (ref 14–72)

## 2022-04-01 ENCOUNTER — Ambulatory Visit (HOSPITAL_COMMUNITY)
Admission: EM | Admit: 2022-04-01 | Discharge: 2022-04-01 | Disposition: A | Discharge status: Home / Self Care | Payer: Medicaid Other | Attending: Psychiatry | Admitting: Psychiatry

## 2022-04-01 DIAGNOSIS — Z8719 Personal history of other diseases of the digestive system: Secondary | ICD-10-CM | POA: Insufficient documentation

## 2022-04-01 DIAGNOSIS — G40909 Epilepsy, unspecified, not intractable, without status epilepticus: Secondary | ICD-10-CM | POA: Insufficient documentation

## 2022-04-01 DIAGNOSIS — Z5986 Financial insecurity: Secondary | ICD-10-CM | POA: Insufficient documentation

## 2022-04-01 DIAGNOSIS — Z635 Disruption of family by separation and divorce: Secondary | ICD-10-CM | POA: Insufficient documentation
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
## 2022-04-01 NOTE — Discharge Instructions (Signed)
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
## 2022-04-01 NOTE — ED Triage Notes (Signed)
Pt presents to [facility] voluntarily, accompanied by EMS due to stress-related concerns. Pt reports that she is dealing with marital issues and wanting to talk with someone. Pt appears very flat, depressed and tearful when expressing her feelings during triage process. Pt denies SI, HI, AVH and substance/alcohol use.

## 2022-04-04 ENCOUNTER — Ambulatory Visit (HOSPITAL_COMMUNITY)
Admission: RE | Admit: 2022-04-04 | Discharge: 2022-04-04 | Disposition: A | Discharge status: Home / Self Care | Payer: Medicaid Other | Source: Ambulatory Visit | Attending: Nurse Practitioner | Admitting: Nurse Practitioner

## 2022-04-04 DIAGNOSIS — R1012 Left upper quadrant pain: Secondary | ICD-10-CM | POA: Insufficient documentation

## 2022-04-04 DIAGNOSIS — R1013 Epigastric pain: Secondary | ICD-10-CM | POA: Insufficient documentation

## 2022-06-07 ENCOUNTER — Encounter: Payer: Self-pay | Admitting: Neurology

## 2022-06-13 ENCOUNTER — Ambulatory Visit: Payer: Medicaid Other | Admitting: Neurology

## 2022-07-02 ENCOUNTER — Encounter: Payer: Self-pay | Admitting: Neurology

## 2022-07-02 ENCOUNTER — Ambulatory Visit: Payer: Medicaid Other | Admitting: Neurology

## 2022-07-02 DIAGNOSIS — G40909 Epilepsy, unspecified, not intractable, without status epilepticus: Secondary | ICD-10-CM

## 2022-07-02 MED ORDER — LAMOTRIGINE 150 MG PO TABS: ORAL_TABLET | ORAL | 3 refills | Status: DC
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
## 2022-07-02 NOTE — Patient Instructions (Signed)
Good to see you. I wish you all the best.  Increase Lamictal (Lamotrigine) 150mg : take 1 tablet in AM, 1 and 1/2 tablets in PM  2. Check Lamictal level 2 weeks after increasing dose  3. Send me a message on MyChart on the letter you need  4. Follow-up in 6 months, call for any changes   Seizure Precautions: 1. If medication has been prescribed for you to prevent seizures, take it exactly as directed.  Do not stop taking the medicine without talking to your doctor first, even if you have not had a seizure in a long time.   2. Avoid activities in which a seizure would cause danger to yourself or to others.  Don't operate dangerous machinery, swim alone, or climb in high or dangerous places, such as on ladders, roofs, or girders.  Do not drive unless your doctor says you may.  3. If you have any warning that you may have a seizure, lay down in a safe place where you can't hurt yourself.    4.  No driving for 6 months from last seizure, as per [facility].   Please refer to the following link on the Epilepsy Foundation of America's website for more information: http://www.epilepsyfoundation.org/answerplace/Social/driving/drivingu.cfm   5.  Maintain good sleep hygiene. Avoid alcohol.  6.  Notify your neurology if you are planning pregnancy or if you become pregnant.  7.  Contact your doctor if you have any problems that may be related to the medicine you are taking.  8.  Call 911 and bring the patient back to the ED if:        A.  The seizure lasts longer than 5 minutes.       B.  The patient doesn't awaken shortly after the seizure  C.  The patient has new problems such as difficulty seeing, speaking or moving  D.  The patient was injured during the seizure  E.  The patient has a temperature over 102 F (39C)  F.  The patient vomited and now is having trouble breathing

## 2022-09-18 ENCOUNTER — Ambulatory Visit (INDEPENDENT_AMBULATORY_CARE_PROVIDER_SITE_OTHER): Payer: Self-pay | Admitting: Nurse Practitioner

## 2022-09-18 ENCOUNTER — Encounter: Payer: Self-pay | Admitting: Nurse Practitioner

## 2022-09-18 VITALS — BP 122/80 | HR 90 | Wt 146.0 lb

## 2022-09-18 DIAGNOSIS — E559 Vitamin D deficiency, unspecified: Secondary | ICD-10-CM

## 2022-09-18 DIAGNOSIS — E039 Hypothyroidism, unspecified: Secondary | ICD-10-CM

## 2022-09-18 DIAGNOSIS — R531 Weakness: Secondary | ICD-10-CM

## 2022-09-18 DIAGNOSIS — G40309 Generalized idiopathic epilepsy and epileptic syndromes, not intractable, without status epilepticus: Secondary | ICD-10-CM

## 2022-09-18 DIAGNOSIS — D509 Iron deficiency anemia, unspecified: Secondary | ICD-10-CM

## 2022-09-18 DIAGNOSIS — F419 Anxiety disorder, unspecified: Secondary | ICD-10-CM

## 2022-09-18 DIAGNOSIS — G40909 Epilepsy, unspecified, not intractable, without status epilepticus: Secondary | ICD-10-CM

## 2022-09-18 DIAGNOSIS — F32A Depression, unspecified: Secondary | ICD-10-CM

## 2022-09-18 LAB — COMPREHENSIVE METABOLIC PANEL
ALT: 33 IU/L — ABNORMAL HIGH (ref 0–32)
AST: 22 IU/L (ref 0–40)
Albumin: 4.4 g/dL (ref 3.9–4.9)
Alkaline Phosphatase: 53 IU/L (ref 44–121)
BUN/Creatinine Ratio: 14 (ref 9–23)
BUN: 10 mg/dL (ref 6–20)
Bilirubin Total: 0.2 mg/dL (ref 0.0–1.2)
CO2: 22 mmol/L (ref 20–29)
Calcium: 9.5 mg/dL (ref 8.7–10.2)
Chloride: 102 mmol/L (ref 96–106)
Creatinine, Ser: 0.74 mg/dL (ref 0.57–1.00)
Globulin, Total: 2.9 g/dL (ref 1.5–4.5)
Glucose: 94 mg/dL (ref 70–99)
Potassium: 4.2 mmol/L (ref 3.5–5.2)
Sodium: 138 mmol/L (ref 134–144)
Total Protein: 7.3 g/dL (ref 6.0–8.5)
eGFR: 105 mL/min/{1.73_m2} (ref >59)

## 2022-09-18 LAB — CBC WITH DIFFERENTIAL/PLATELET
Basophils Absolute: 0.1 10*3/uL (ref 0.0–0.2)
Basos: 2 %
EOS (ABSOLUTE): 0.1 10*3/uL (ref 0.0–0.4)
Eos: 2 %
Hematocrit: 37.3 % (ref 34.0–46.6)
Hemoglobin: 11.2 g/dL (ref 11.1–15.9)
Immature Grans (Abs): 0 10*3/uL (ref 0.0–0.1)
Immature Granulocytes: 0 %
Lymphocytes Absolute: 1.7 10*3/uL (ref 0.7–3.1)
Lymphs: 35 %
MCH: 24.2 pg — ABNORMAL LOW (ref 26.6–33.0)
MCHC: 30 g/dL — ABNORMAL LOW (ref 31.5–35.7)
MCV: 81 fL (ref 79–97)
Monocytes: 8 %
Neutrophils Absolute: 2.6 10*3/uL (ref 1.4–7.0)
Neutrophils: 53 %
Platelets: 297 10*3/uL (ref 150–450)
RBC: 4.62 x10E6/uL (ref 3.77–5.28)
RDW: 14.6 % (ref 11.7–15.4)
WBC: 4.9 10*3/uL (ref 3.4–10.8)

## 2022-09-18 LAB — VITAMIN D 25 HYDROXY (VIT D DEFICIENCY, FRACTURES)

## 2022-09-18 LAB — MAGNESIUM

## 2022-09-18 LAB — IRON,TIBC AND FERRITIN PANEL

## 2022-09-18 LAB — VITAMIN B12

## 2022-09-18 LAB — ACHR ABS WITH REFLEX TO MUSK

## 2022-09-18 LAB — TSH

## 2022-09-18 MED ORDER — VITAMIN D (ERGOCALCIFEROL) 1.25 MG (50000 UNIT) PO CAPS
50000.0000 [IU] | ORAL_CAPSULE | ORAL | 3 refills | Status: DC
Start: 2022-09-18 — End: 2023-09-04

## 2022-09-18 NOTE — Patient Instructions (Addendum)
I will complete the FMLA paperwork for you. Please go ahead and let you employer know that you need new paperwork so they can start a new claim, if they need to. I am going to request that you go to part-time employment, but also allow for continued intermittent leave as needed for exacerbations.   I will compose the letter for you this evening and send this in MyChart and through email so you have a copy for your records. I will also plan to mail an original so you have that if needed for court. If you would like to share my email with your attorney to give me additional information that is recommended in the letter, please feel free to do that.

## 2022-09-18 NOTE — Progress Notes (Signed)
Tollie Eth, DNP, AGNP-c [facility] [address] [place], [place] [address] [phone]   ACUTE VISIT- ESTABLISHED PATIENT  Blood pressure 122/80, pulse 90, weight 146 lb (66.2 kg).  Subjective:  HPI Margaret [name] is a 37 y.o. female presents to day for evaluation of: Weakness.   Margaret [name] tells me for about the past month she has been experiencing extreme fatigue, loss of concentration, weakness, and depression symptoms which have been worsening over the last several months. She has been sleeping much better and is not currently experiencing the nightly twitching or waking with stiffness since she has increased her lamictal dosage.  The fatigue, muscle weakness, and brain fog are still very prominent features at this time and have continually worsened. She feels that stressors from her job and her personal life may be exacerbating this condition. She has not had any changes in the last month other than medication increase of lamictal. She is going through a divorce at this time and the stress of this has significantly had a negative impact on her health with increased seizure activity and associated symptoms.   We discussed changing her FMLA to limit her work to part time given that she is having severe symptoms that are currently debilitating in nature at times. Her long work hours and the use of a computer screen constantly and headphones seem to exacerbate her symptoms ans stress level. She has made accommodations with her equipment at work, however, these have not helped to reduce the symptoms she is experiencing. She tells me when she works shorter hours or has taken off of work the symptoms resolve, but return when she returns to full time work.  She reports dizziness, ear pain, neck pain, twitching, headaches, visual changes, ear pressures and "pounding sounds" in the ears, mental fatigue, brain fog, seizures, and mental exhaustion are the primary symptoms she experiences.  Mentally she reports this has been exhausting and debilitating in nature for her. She expresses frustration over being unable to find accommodations that allow for improvement of her experience.  Her symptoms have been ongoing for over a year, but have been compounding in recent months and have started to interfere with her every day life.    She is in need of a letter for spousal support that states that she is under care for a chronic medical condition with exacerbations that limit her ability to work full time. She is currently transitioning to part-time work with exceptions listed that she may need additional time during acute periods or to transition out of work completely in the future based on her response.  She is under the medical care of primary care and neurology at this time for this condition and medication management is in place and being adjusted to optimal control.    PMH, Medications, and Allergies reviewed and updated in chart as appropriate.   ROS negative except for what is listed in HPI. Objective:  Physical Exam Vitals and nursing note reviewed.  Constitutional:      Appearance: Normal appearance.  HENT:     Head: Normocephalic.  Eyes:     Pupils: Pupils are equal, round, and reactive to light.  Cardiovascular:     Rate and Rhythm: Normal rate and regular rhythm.     Pulses: Normal pulses.     Heart sounds: Normal heart sounds.  Pulmonary:     Effort: Pulmonary effort is normal.     Breath sounds: Normal breath sounds.  Abdominal:     General:  Bowel sounds are normal.     Palpations: Abdomen is soft.  Musculoskeletal:        General: Normal range of motion.     Cervical back: Normal range of motion.  Skin:    General: Skin is warm.  Neurological:     General: No focal deficit present.     Mental Status: She is alert and oriented to person, place, and time.     Sensory: No sensory deficit.     Motor: Weakness present.     Coordination: Coordination normal.   Psychiatric:        Attention and Perception: Attention normal.        Mood and Affect: Mood is depressed. Affect is flat and tearful.        Speech: Speech normal.        Behavior: Behavior is cooperative.        Thought Content: Thought content normal.        Cognition and Memory: Memory normal.        Judgment: Judgment normal.         Assessment & Plan:   Problem List Items Addressed This Visit     Seizure disorder (HCC)    Chronic seizure d/o currently under the care of neurology. Recent increase in lamictal has shown positive benefit for some symptoms, which indicate these were related to seizure activity. We discussed the importance of reducing stress levels to better control her symptoms and reduce further exacerbations. I will complete FMLA paperwork to request accommodations for part time employment and monitor.       Relevant Medications   Vitamin D, Ergocalciferol, (DRISDOL) 1.25 MG (50000 UNIT) CAPS capsule   Other Relevant Orders   Vitamin B12 (Completed)   CBC with Differential/Platelet (Completed)   Comprehensive metabolic panel (Completed)   Iron, TIBC and Ferritin Panel (Completed)   VITAMIN D 25 Hydroxy (Vit-D Deficiency, Fractures) (Completed)   TSH (Completed)   AChR Abs with Reflex to MuSK (Completed)   Magnesium (Completed)   Anxiety and depression    Margaret [name]'s anxiety and depression symptoms do appear to be worsened at this time. She is separated from her husband and working with an attorney for the divorce which has understandably been very emotionally difficult. Unfortunately, her stress levels have increased to the level that they are affecting her physical health. She has tried medication in the past, but has not had success. At this time she would like to work on counseling and therapeutic measures to help better control this. We will continue to work on accommodations with work to better help with control. FMLA will be completed for part time employment  and we will re-evaluate in 3-4 months.       Relevant Medications   Vitamin D, Ergocalciferol, (DRISDOL) 1.25 MG (50000 UNIT) CAPS capsule   Other Relevant Orders   Vitamin B12 (Completed)   CBC with Differential/Platelet (Completed)   Comprehensive metabolic panel (Completed)   Iron, TIBC and Ferritin Panel (Completed)   VITAMIN D 25 Hydroxy (Vit-D Deficiency, Fractures) (Completed)   TSH (Completed)   AChR Abs with Reflex to MuSK (Completed)   Magnesium (Completed)   Vitamin D deficiency    Repeat labs today. Historically very low readings requiring ongoing high dose therapy to keep levels WNL.       Relevant Medications   Vitamin D, Ergocalciferol, (DRISDOL) 1.25 MG (50000 UNIT) CAPS capsule   Other Relevant Orders   Vitamin B12 (Completed)   CBC with  Differential/Platelet (Completed)   Comprehensive metabolic panel (Completed)   Iron, TIBC and Ferritin Panel (Completed)   VITAMIN D 25 Hydroxy (Vit-D Deficiency, Fractures) (Completed)   TSH (Completed)   AChR Abs with Reflex to MuSK (Completed)   Magnesium (Completed)   Nonintractable generalized idiopathic epilepsy without status epilepticus (HCC)    Recent increase in lamictal has helped to control symptoms at this time. We will continue to monitor closely with neurology.       Iron deficiency anemia    Will repeat labs. Given her current symptoms and history, her muscle weakness and fatigue could be related to ongoing complications. Continue iron supplementation for now.       Relevant Medications   Vitamin D, Ergocalciferol, (DRISDOL) 1.25 MG (50000 UNIT) CAPS capsule   Other Relevant Orders   Vitamin B12 (Completed)   CBC with Differential/Platelet (Completed)   Comprehensive metabolic panel (Completed)   Iron, TIBC and Ferritin Panel (Completed)   VITAMIN D 25 Hydroxy (Vit-D Deficiency, Fractures) (Completed)   TSH (Completed)   AChR Abs with Reflex to MuSK (Completed)   Magnesium (Completed)   Acquired  hypothyroidism    Chronic hypothyroidism, not currently managed with medication. We discussed the importance of medication for control of this chronic condition. This certainly could be a contributor to some of the weakness she is experiencing and associated symptoms, including mood. We will make changes based on labs.       Relevant Medications   Vitamin D, Ergocalciferol, (DRISDOL) 1.25 MG (50000 UNIT) CAPS capsule   Other Relevant Orders   Vitamin B12 (Completed)   CBC with Differential/Platelet (Completed)   Comprehensive metabolic panel (Completed)   Iron, TIBC and Ferritin Panel (Completed)   VITAMIN D 25 Hydroxy (Vit-D Deficiency, Fractures) (Completed)   TSH (Completed)   AChR Abs with Reflex to MuSK (Completed)   Magnesium (Completed)   Other Visit Diagnoses     Weakness    -  Primary   Relevant Orders   Vitamin B12 (Completed)   CBC with Differential/Platelet (Completed)   Comprehensive metabolic panel (Completed)   Iron, TIBC and Ferritin Panel (Completed)   VITAMIN D 25 Hydroxy (Vit-D Deficiency, Fractures) (Completed)   TSH (Completed)   AChR Abs with Reflex to MuSK (Completed)   Magnesium (Completed)        Email and send information through MyChart so the patient has all she needs in her files.    Tollie Eth, DNP, AGNP-c   History, Medications, Surgery, SDOH, and Family History reviewed and updated as appropriate.

## 2022-09-19 ENCOUNTER — Other Ambulatory Visit: Payer: Self-pay | Admitting: Nurse Practitioner

## 2022-09-19 ENCOUNTER — Encounter: Payer: Self-pay | Admitting: Nurse Practitioner

## 2022-09-19 DIAGNOSIS — D649 Anemia, unspecified: Secondary | ICD-10-CM

## 2022-09-19 DIAGNOSIS — E039 Hypothyroidism, unspecified: Secondary | ICD-10-CM

## 2022-09-19 MED ORDER — IRON (FERROUS SULFATE) 325 (65 FE) MG PO TABS
325.0000 mg | ORAL_TABLET | Freq: Every day | ORAL | 1 refills | Status: DC
Start: 2022-09-19 — End: 2023-09-04

## 2022-09-19 MED ORDER — LEVOTHYROXINE SODIUM 25 MCG PO TABS
25.0000 ug | ORAL_TABLET | Freq: Every day | ORAL | 3 refills | Status: DC
Start: 2022-09-19 — End: 2023-01-04

## 2022-09-27 ENCOUNTER — Encounter: Payer: Self-pay | Admitting: Nurse Practitioner

## 2022-09-30 LAB — CBC WITH DIFFERENTIAL/PLATELET: Monocytes Absolute: 0.4 10*3/uL (ref 0.1–0.9)

## 2022-09-30 NOTE — Assessment & Plan Note (Signed)
Chronic hypothyroidism, not currently managed with medication. We discussed the importance of medication for control of this chronic condition. This certainly could be a contributor to some of the weakness she is experiencing and associated symptoms, including mood. We will make changes based on labs.

## 2022-09-30 NOTE — Assessment & Plan Note (Signed)
Recent increase in lamictal has helped to control symptoms at this time. We will continue to monitor closely with neurology.

## 2022-09-30 NOTE — Assessment & Plan Note (Signed)
Will repeat labs. Given her current symptoms and history, her muscle weakness and fatigue could be related to ongoing complications. Continue iron supplementation for now.

## 2022-09-30 NOTE — Assessment & Plan Note (Signed)
Chronic seizure d/o currently under the care of neurology. Recent increase in lamictal has shown positive benefit for some symptoms, which indicate these were related to seizure activity. We discussed the importance of reducing stress levels to better control her symptoms and reduce further exacerbations. I will complete FMLA paperwork to request accommodations for part time employment and monitor.

## 2022-09-30 NOTE — Assessment & Plan Note (Signed)
Repeat labs today. Historically very low readings requiring ongoing high dose therapy to keep levels WNL.

## 2022-09-30 NOTE — Assessment & Plan Note (Signed)
Margaret [name]'s anxiety and depression symptoms do appear to be worsened at this time. She is separated from her husband and working with an attorney for the divorce which has understandably been very emotionally difficult. Unfortunately, her stress levels have increased to the level that they are affecting her physical health. She has tried medication in the past, but has not had success. At this time she would like to work on counseling and therapeutic measures to help better control this. We will continue to work on accommodations with work to better help with control. FMLA will be completed for part time employment and we will re-evaluate in 3-4 months.

## 2022-10-08 ENCOUNTER — Encounter: Payer: Self-pay | Admitting: Nurse Practitioner

## 2022-10-08 ENCOUNTER — Telehealth (INDEPENDENT_AMBULATORY_CARE_PROVIDER_SITE_OTHER): Payer: Self-pay | Admitting: Nurse Practitioner

## 2022-10-08 VITALS — Wt 145.0 lb

## 2022-10-08 DIAGNOSIS — F419 Anxiety disorder, unspecified: Secondary | ICD-10-CM

## 2022-10-08 DIAGNOSIS — G40909 Epilepsy, unspecified, not intractable, without status epilepticus: Secondary | ICD-10-CM

## 2022-10-08 DIAGNOSIS — E039 Hypothyroidism, unspecified: Secondary | ICD-10-CM

## 2022-10-08 DIAGNOSIS — F32A Depression, unspecified: Secondary | ICD-10-CM

## 2022-10-08 NOTE — Progress Notes (Signed)
Virtual Visit Encounter telephone visit. Video unable to connect due to issues with server.   I connected with  Margaret [name] on 10/13/22 at 11:30 AM EDT by secure audio telemedicine application. I verified that I am speaking with the correct person using two identifiers.   I introduced myself as a Publishing rights manager with the practice. The limitations of evaluation and management by telemedicine discussed with the patient and the availability of in person appointments. The patient expressed verbal understanding and consent to proceed.  Participating parties in this visit include: Myself and patient  The patient is: Patient Location: Home I am: Provider Location: Office/Clinic Subjective:    CC and HPI: Margaret [name] is a 37 y.o. year old female presenting for follow up of mood. Patient reports the following:  Margaret [name] tells me that her mood is improved since we last met. She has been to court, but the proceedings for divorce has been put on hold at this time. She tells me there has been some miscommunication between her and her husband, but she is not sure the relationship is salvageable at this time. For now, the stress of the hearing has been postponed.   She has submitted paperwork to her company for decrease to part time employment due to ongoing health issues. She is waiting for their approval. She reports that the relief that she has experienced since knowing that this stressor will be reduced is helping with her mental health as well.   She tells me she feels better with her medication for her thyroid at this time. She has not had any new seizures since our last visit.   Past medical history, Surgical history, Family history not pertinant except as noted below, Social history, Allergies, and medications have been entered into the medical record, reviewed, and corrections made.   Review of Systems:  All review of systems negative except what is listed in the HPI  Objective:    Alert and  oriented x 4 Speaking in clear sentences with no shortness of breath. No distress.  Impression and Recommendations:    Problem List Items Addressed This Visit     Seizure disorder (HCC)    No recent seizure activity. Continue current therapy and monitoring with neurology. Will continue to provide supportive care.       Anxiety and depression - Primary    Chronic. Improvement is noted today with the hearing being over and changes in her pending divorce. She also expresses relief with upcoming reduced workload. She does not wish to start medication at this time. We can revisit this as needed in the future. Counseling is strongly encouraged to help as changes are pending.       Acquired hypothyroidism    Chronic. Currently improved symptoms with restart of levothyroxine. Recommend not stopping medication as the condition is lifelong and requires management. Will monitor labs in the next 4 weeks.        current treatment plan is effective, no change in therapy I discussed the assessment and treatment plan with the patient. The patient was provided an opportunity to ask questions and all were answered. The patient agreed with the plan and demonstrated an understanding of the instructions.   The patient was advised to call back or seek an in-person evaluation if the symptoms worsen or if the condition fails to improve as anticipated.  Follow-Up: 4 weeks for thyroid labs  I provided 22 minutes of non-face-to-face interaction with this non face-to-face encounter including intake, same-day documentation, and  chart review.   Tollie Eth, NP , DNP, AGNP-c [facility]

## 2022-10-13 NOTE — Assessment & Plan Note (Signed)
No recent seizure activity. Continue current therapy and monitoring with neurology. Will continue to provide supportive care.

## 2022-10-13 NOTE — Assessment & Plan Note (Signed)
Chronic. Improvement is noted today with the hearing being over and changes in her pending divorce. She also expresses relief with upcoming reduced workload. She does not wish to start medication at this time. We can revisit this as needed in the future. Counseling is strongly encouraged to help as changes are pending.

## 2022-10-13 NOTE — Assessment & Plan Note (Signed)
Chronic. Currently improved symptoms with restart of levothyroxine. Recommend not stopping medication as the condition is lifelong and requires management. Will monitor labs in the next 4 weeks.

## 2022-11-02 ENCOUNTER — Encounter: Payer: Self-pay | Admitting: Nurse Practitioner

## 2022-11-09 ENCOUNTER — Encounter: Payer: Self-pay | Admitting: Nurse Practitioner

## 2022-11-09 DIAGNOSIS — F339 Major depressive disorder, recurrent, unspecified: Secondary | ICD-10-CM

## 2022-11-09 DIAGNOSIS — F413 Other mixed anxiety disorders: Secondary | ICD-10-CM

## 2022-11-13 ENCOUNTER — Other Ambulatory Visit: Payer: Self-pay

## 2022-11-13 ENCOUNTER — Encounter: Payer: Self-pay | Admitting: Nurse Practitioner

## 2022-11-13 DIAGNOSIS — F419 Anxiety disorder, unspecified: Secondary | ICD-10-CM

## 2022-11-14 MED ORDER — VENLAFAXINE HCL ER 37.5 MG PO CP24: 37.5000 mg | ORAL_CAPSULE | Freq: Every day | ORAL | 12 refills | Status: DC

## 2022-12-25 ENCOUNTER — Institutional Professional Consult (permissible substitution): Payer: Medicaid Other | Admitting: Plastic Surgery

## 2023-01-04 ENCOUNTER — Encounter: Payer: Self-pay | Admitting: Nurse Practitioner

## 2023-01-04 ENCOUNTER — Ambulatory Visit: Payer: Medicaid Other | Admitting: Nurse Practitioner

## 2023-01-04 VITALS — BP 120/78 | HR 67 | Ht 68.0 in | Wt 152.0 lb

## 2023-01-04 DIAGNOSIS — F419 Anxiety disorder, unspecified: Secondary | ICD-10-CM

## 2023-01-04 DIAGNOSIS — Z79899 Other long term (current) drug therapy: Secondary | ICD-10-CM

## 2023-01-04 DIAGNOSIS — E039 Hypothyroidism, unspecified: Secondary | ICD-10-CM

## 2023-01-04 DIAGNOSIS — G40909 Epilepsy, unspecified, not intractable, without status epilepticus: Secondary | ICD-10-CM

## 2023-01-04 DIAGNOSIS — G40309 Generalized idiopathic epilepsy and epileptic syndromes, not intractable, without status epilepticus: Secondary | ICD-10-CM | POA: Diagnosis not present

## 2023-01-04 DIAGNOSIS — F32A Depression, unspecified: Secondary | ICD-10-CM

## 2023-01-04 DIAGNOSIS — D509 Iron deficiency anemia, unspecified: Secondary | ICD-10-CM

## 2023-01-04 DIAGNOSIS — E559 Vitamin D deficiency, unspecified: Secondary | ICD-10-CM

## 2023-01-04 MED ORDER — ALPRAZOLAM 0.5 MG PO TABS: 0.2500 mg | ORAL_TABLET | Freq: Two times a day (BID) | ORAL | 0 refills | Status: DC | PRN

## 2023-01-04 MED ORDER — LEVOTHYROXINE SODIUM 25 MCG PO TABS: 25.0000 ug | ORAL_TABLET | Freq: Every day | ORAL | 3 refills | Status: DC

## 2023-01-04 NOTE — Progress Notes (Unsigned)
  Shawna Clamp, DNP, AGNP-c [facility]  [address] [place], [place] [address] [phone]  ESTABLISHED PATIENT- Chronic Health and/or Follow-Up Visit  There were no vitals taken for this visit.    Kailly Bickham is a 37 y.o. year old female presenting today for evaluation and management of chronic conditions.     All ROS negative with exception of what is listed above.   PHYSICAL EXAM Physical Exam   PLAN Problem List Items Addressed This Visit   None   No follow-ups on file.  Shawna Clamp, DNP, AGNP-c

## 2023-01-05 LAB — COMPREHENSIVE METABOLIC PANEL
ALT: 21 [IU]/L (ref 0–32)
AST: 15 IU/L (ref 0–40)
Albumin: 4.2 g/dL (ref 3.9–4.9)
Alkaline Phosphatase: 53 IU/L (ref 44–121)
BUN/Creatinine Ratio: 18 (ref 9–23)
BUN: 12 mg/dL (ref 6–20)
Bilirubin Total: <0.2 mg/dL (ref 0.0–1.2)
CO2: 24 mmol/L (ref 20–29)
Calcium: 9.4 mg/dL (ref 8.7–10.2)
Chloride: 100 mmol/L (ref 96–106)
Creatinine, Ser: 0.66 mg/dL (ref 0.57–1.00)
Globulin, Total: 2.8 g/dL (ref 1.5–4.5)
Glucose: 87 mg/dL (ref 70–99)
Potassium: 4.3 mmol/L (ref 3.5–5.2)
Sodium: 137 mmol/L (ref 134–144)
Total Protein: 7 g/dL (ref 6.0–8.5)
eGFR: 114 mL/min/{1.73_m2} (ref >59)

## 2023-01-05 LAB — IRON,TIBC AND FERRITIN PANEL
Ferritin: 30 ng/mL (ref 15–150)
Iron Saturation: 15 % (ref 15–55)
Iron: 55 ug/dL (ref 27–159)
Total Iron Binding Capacity: 379 ug/dL (ref 250–450)
UIBC: 324 ug/dL (ref 131–425)

## 2023-01-05 LAB — CBC WITH DIFFERENTIAL/PLATELET
Basophils Absolute: 0.1 10*3/uL (ref 0.0–0.2)
Basos: 1 %
EOS (ABSOLUTE): 0.2 10*3/uL (ref 0.0–0.4)
Eos: 3 %
Hematocrit: 37.4 % (ref 34.0–46.6)
Hemoglobin: 12.2 g/dL (ref 11.1–15.9)
Immature Grans (Abs): 0 10*3/uL (ref 0.0–0.1)
Immature Granulocytes: 1 %
Lymphocytes Absolute: 2.3 10*3/uL (ref 0.7–3.1)
Lymphs: 44 %
MCH: 28.8 pg (ref 26.6–33.0)
MCHC: 32.6 g/dL (ref 31.5–35.7)
MCV: 88 fL (ref 79–97)
Monocytes Absolute: 0.4 10*3/uL (ref 0.1–0.9)
Monocytes: 8 %
Neutrophils Absolute: 2.2 10*3/uL (ref 1.4–7.0)
Neutrophils: 43 %
Platelets: 281 10*3/uL (ref 150–450)
RBC: 4.23 x10E6/uL (ref 3.77–5.28)
RDW: 13.6 % (ref 11.7–15.4)
WBC: 5.1 10*3/uL (ref 3.4–10.8)

## 2023-01-05 LAB — VITAMIN B12: Vitamin B-12: 520 pg/mL (ref 232–1245)

## 2023-01-05 LAB — T4, FREE: Free T4: 1.44 ng/dL (ref 0.82–1.77)

## 2023-01-05 LAB — TSH: TSH: 6.19 u[IU]/mL — ABNORMAL HIGH (ref 0.450–4.500)

## 2023-01-05 LAB — VITAMIN D 25 HYDROXY (VIT D DEFICIENCY, FRACTURES): Vit D, 25-Hydroxy: 36.1 ng/mL (ref 30.0–100.0)

## 2023-01-14 ENCOUNTER — Ambulatory Visit (HOSPITAL_COMMUNITY): Payer: Medicaid Other | Admitting: Clinical

## 2023-01-23 ENCOUNTER — Encounter: Payer: Self-pay | Admitting: Neurology

## 2023-01-23 ENCOUNTER — Ambulatory Visit: Payer: Medicaid Other | Admitting: Neurology

## 2023-01-23 VITALS — BP 123/74 | HR 79 | Ht 68.0 in | Wt 152.4 lb

## 2023-01-23 DIAGNOSIS — G40309 Generalized idiopathic epilepsy and epileptic syndromes, not intractable, without status epilepticus: Secondary | ICD-10-CM

## 2023-01-23 DIAGNOSIS — G40909 Epilepsy, unspecified, not intractable, without status epilepticus: Secondary | ICD-10-CM

## 2023-01-23 MED ORDER — LAMOTRIGINE 150 MG PO TABS: ORAL_TABLET | ORAL | 3 refills | Status: DC
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
## 2023-01-23 NOTE — Patient Instructions (Signed)
Good to see you. Continue Lamotrigine 150mg  twice a day. Follow-up in 6 months, call for any changes.    Seizure Precautions: 1. If medication has been prescribed for you to prevent seizures, take it exactly as directed.  Do not stop taking the medicine without talking to your doctor first, even if you have not had a seizure in a long time.   2. Avoid activities in which a seizure would cause danger to yourself or to others.  Don't operate dangerous machinery, swim alone, or climb in high or dangerous places, such as on ladders, roofs, or girders.  Do not drive unless your doctor says you may.  3. If you have any warning that you may have a seizure, lay down in a safe place where you can't hurt yourself.    4.  No driving for 6 months from last seizure, as per [facility].   Please refer to the following link on the Epilepsy Foundation of America's website for more information: http://www.epilepsyfoundation.org/answerplace/Social/driving/drivingu.cfm   5.  Maintain good sleep hygiene. Avoid alcohol.  6.  Notify your neurology if you are planning pregnancy or if you become pregnant.  7.  Contact your doctor if you have any problems that may be related to the medicine you are taking.  8.  Call 911 and bring the patient back to the ED if:        A.  The seizure lasts longer than 5 minutes.       B.  The patient doesn't awaken shortly after the seizure  C.  The patient has new problems such as difficulty seeing, speaking or moving  D.  The patient was injured during the seizure  E.  The patient has a temperature over 102 F (39C)  F.  The patient vomited and now is having trouble breathing

## 2023-01-24 ENCOUNTER — Other Ambulatory Visit: Payer: Self-pay | Admitting: Nurse Practitioner

## 2023-01-24 DIAGNOSIS — F419 Anxiety disorder, unspecified: Secondary | ICD-10-CM

## 2023-01-25 NOTE — Telephone Encounter (Signed)
Last apt 01/04/23

## 2023-01-30 ENCOUNTER — Ambulatory Visit: Payer: Medicaid Other | Admitting: Neurology

## 2023-03-08 ENCOUNTER — Ambulatory Visit (HOSPITAL_COMMUNITY): Payer: Medicaid Other | Admitting: Clinical

## 2023-03-08 DIAGNOSIS — F330 Major depressive disorder, recurrent, mild: Secondary | ICD-10-CM | POA: Diagnosis not present

## 2023-03-14 ENCOUNTER — Institutional Professional Consult (permissible substitution): Payer: Medicaid Other | Admitting: Plastic Surgery

## 2023-03-19 ENCOUNTER — Ambulatory Visit (HOSPITAL_COMMUNITY): Payer: Medicaid Other | Admitting: Clinical
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
## 2023-05-03 NOTE — Progress Notes (Signed)
 Comprehensive Clinical Assessment (CCA) Note  03/08/2023 Margaret [name] [id number] Virtual Visit via Video Note  I connected with Margaret [name] on 03/08/2023 at 11:00 AM EST by a video enabled telemedicine application and verified that I am speaking with the correct person using two identifiers.  Location: Patient: home Provider: office   I discussed the limitations of evaluation and management by telemedicine and the availability of in person appointments. The patient expressed understanding and agreed to proceed.   Follow Up Instructions: I discussed the assessment and treatment plan with the patient. The patient was provided an opportunity to ask questions and all were answered. The patient agreed with the plan and demonstrated an understanding of the instructions.   The patient was advised to call back or seek an in-person evaluation if the symptoms worsen or if the condition fails to improve as anticipated.  I provided 30 minutes of non-face-to-face time during this encounter.   Loree Fee, LCSW  Chief Complaint:  Chief Complaint  Patient presents with   Depression   Anxiety   Visit Diagnosis:   Major depressive disorder, recurrent episode, mild  Interpretive Summary:   Client is a 38 year old female presenting to the [facility] to establish outpatient care.  Client reported she is referred by her primary care physician due to symptoms of depression and anxiety.  Client reported her primary stressor that has been the source of her negative emotions has been her marriage.  Client reported she is in the middle of a divorce and custody battle.  Client reported she needs support to get through at all.  Client reported she is upset when it started.  Client reported that she is from [place] and of Muslim faith.  Client reported her husband wanted to have a religious second marriage but she was not in agreement for that.  Client reported she tried to stick  it out but decided to initiate filing for divorce in February 2023.  Client reported they have been married since 2012.  Client reported he lives out of the state for his job but she lives in [place] with her children.  Client reported her immediate family does not live in the state but they support her from a distance.  Client reported she has no other history for mental health concerns or illicit substance use. Client presented oriented x 5, appropriately dressed, and friendly.  Client denied hallucinations, delusions, suicidal and homicidal ideations.  Treatment recommendations: Individual counseling.  Client declined psychiatry at this time.   CCA Biopsychosocial Intake/Chief Complaint:  client is presenting by her own referral for help with depression and anxiety related to marital issues.  Current Symptoms/Problems: client reported overthinking, worrying  Patient Reported Schizophrenia/Schizoaffective Diagnosis in Past: No  Strengths: voluntarily engaging in services  Preferences: counseling  Abilities: discuss problems and history  Type of Services Patient Feels are Needed: individual therapy  Initial Clinical Notes/Concerns: No data recorded  Mental Health Symptoms Depression:  Change in energy/activity   Duration of Depressive symptoms: Greater than two weeks   Mania:  None   Anxiety:   Difficulty concentrating   Psychosis:  None   Duration of Psychotic symptoms: No data recorded  Trauma:  None   Obsessions:  None   Compulsions:  None   Inattention:  None   Hyperactivity/Impulsivity:  None   Oppositional/Defiant Behaviors:  None   Emotional Irregularity:  None   Other Mood/Personality Symptoms:  No data recorded   Mental Status Exam Appearance and  self-care  Stature:  Average   Weight:  Average weight   Clothing:  Casual   Grooming:  Normal   Cosmetic use:  Age appropriate   Posture/gait:  Normal   Motor activity:  Not Remarkable    Sensorium  Attention:  Normal   Concentration:  Normal   Orientation:  X5   Recall/memory:  Normal   Affect and Mood  Affect:  Congruent   Mood:  Depressed   Relating  Eye contact:  Normal   Facial expression:  Responsive   Attitude toward examiner:  Cooperative   Thought and Language  Speech flow: Clear and Coherent   Thought content:  Appropriate to Mood and Circumstances   Preoccupation:  None   Hallucinations:  None   Organization:  No data recorded  [employer] of Knowledge:  Good   Intelligence:  Average   Abstraction:  Normal   Judgement:  Normal   Reality Testing:  Adequate   Insight:  Good   Decision Making:  Normal   Social Functioning  Social Maturity:  Responsible   Social Judgement:  Normal   Stress  Stressors:  Relationship; Transitions; Work   Coping Ability:  Normal   Skill Deficits:  Activities of daily living   Supports:  Friends/Service system; Family     Religion: Religion/Spirituality Are You A Religious Person?: Yes  Leisure/Recreation: Leisure / Recreation Do You Have Hobbies?: No  Exercise/Diet: Exercise/Diet Do You Exercise?: No Have You Gained or Lost A Significant Amount of Weight in the Past Six Months?: No Do You Follow a Special Diet?: No Do You Have Any Trouble Sleeping?: No   CCA Employment/Education Employment/Work Situation: Employment / Work Situation Employment Situation: Employed Where is Patient Currently Employed?: client reported she recently started a job at [employer] Satisfied With Your Job?: Yes  Education: Education 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 client reported she filed for divorce in 2023. client  reported her husband wanted to have a religious second marriage. client reported her husband lives in  for work. client reported he has dated other women and been verbally absuive towards her. Does patient have children?: Yes How many children?: 2 How is patient's relationship with their children?: client reported she has a son and daughter age 82 and 59.  Childhood History:  Childhood History By whom was/is the patient raised?: Both parents Additional childhood history information: client reported she was born and raised in [place]. client reported she has a good childhood. Does patient have siblings?: Yes Number of Siblings: 6 Description of patient's current relationship with siblings: client reported she has brothers. Did patient suffer any verbal/emotional/physical/sexual abuse as a child?: No Did patient suffer from severe childhood neglect?: No Has patient ever been sexually abused/assaulted/raped as an adolescent or adult?: No Was the patient ever a victim of a crime or a disaster?: No Witnessed domestic violence?: No Has patient been affected by domestic violence as an adult?: No  Child/Adolescent Assessment:     CCA Substance Use Alcohol/Drug Use: Alcohol / Drug Use History of alcohol / drug use?: No history of alcohol / drug abuse  ASAM's:  Six Dimensions of Multidimensional Assessment  Dimension 1:  Acute Intoxication and/or Withdrawal Potential:      Dimension 2:  Biomedical Conditions and Complications:      Dimension 3:  Emotional, Behavioral, or Cognitive Conditions and Complications:     Dimension 4:  Readiness to Change:     Dimension 5:  Relapse, Continued use, or Continued Problem Potential:     Dimension 6:  Recovery/Living Environment:     ASAM Severity Score:    ASAM Recommended Level of Treatment:     Substance use Disorder (SUD)    Recommendations for Services/Supports/Treatments: Recommendations for  Services/Supports/Treatments Recommendations For Services/Supports/Treatments: Individual Therapy  DSM5 Diagnoses: Patient Active Problem List   Diagnosis Date Noted   Intractable migraine without aura and without status migrainosus 01/31/2022   Acquired hypothyroidism 01/31/2022   Nonintractable generalized idiopathic epilepsy without status epilepticus (HCC) 04/13/2021   Left leg weakness 04/13/2021   Iron deficiency anemia 04/13/2021   GERD (gastroesophageal reflux disease) 08/02/2020   Vitamin D deficiency 01/20/2020   History of pancreatitis 01/20/2020   Anxiety and depression 04/24/2019   Seizure disorder (HCC) 07/30/2017   Regular astigmatism of left eye 03/14/2016   Amblyopia 05/16/2015   Irregular astigmatism of right eye 10/11/2014    Patient Centered Plan: Patient is on the following Treatment Plan(s):  Depression   Referrals to Alternative Service(s): Referred to Alternative Service(s):   Place:   Date:   Time:    Referred to Alternative Service(s):   Place:   Date:   Time:    Referred to Alternative Service(s):   Place:   Date:   Time:    Referred to Alternative Service(s):   Place:   Date:   Time:      Collaboration of Care: Referral or follow-up with counselor/therapist [facility]  Patient/Guardian was advised Release of Information must be obtained prior to any record release in order to collaborate their care with an outside provider. Patient/Guardian was advised if they have not already done so to contact the registration department to sign all necessary forms in order for [place] to release information regarding their care.   Consent: Patient/Guardian gives verbal consent for treatment and assignment of benefits for services provided during this visit. Patient/Guardian expressed understanding and agreed to proceed.   Margaret Rhymes Jinnifer Montejano, LCSW

## 2023-05-10 IMAGING — DX DG CHEST 2V
2 series · 2 of 2 positions shown · non-contrast
Comparison: None.

CLINICAL DATA: Cough, chest pain

EXAM:
CHEST - 2 VIEW

[chest pa]
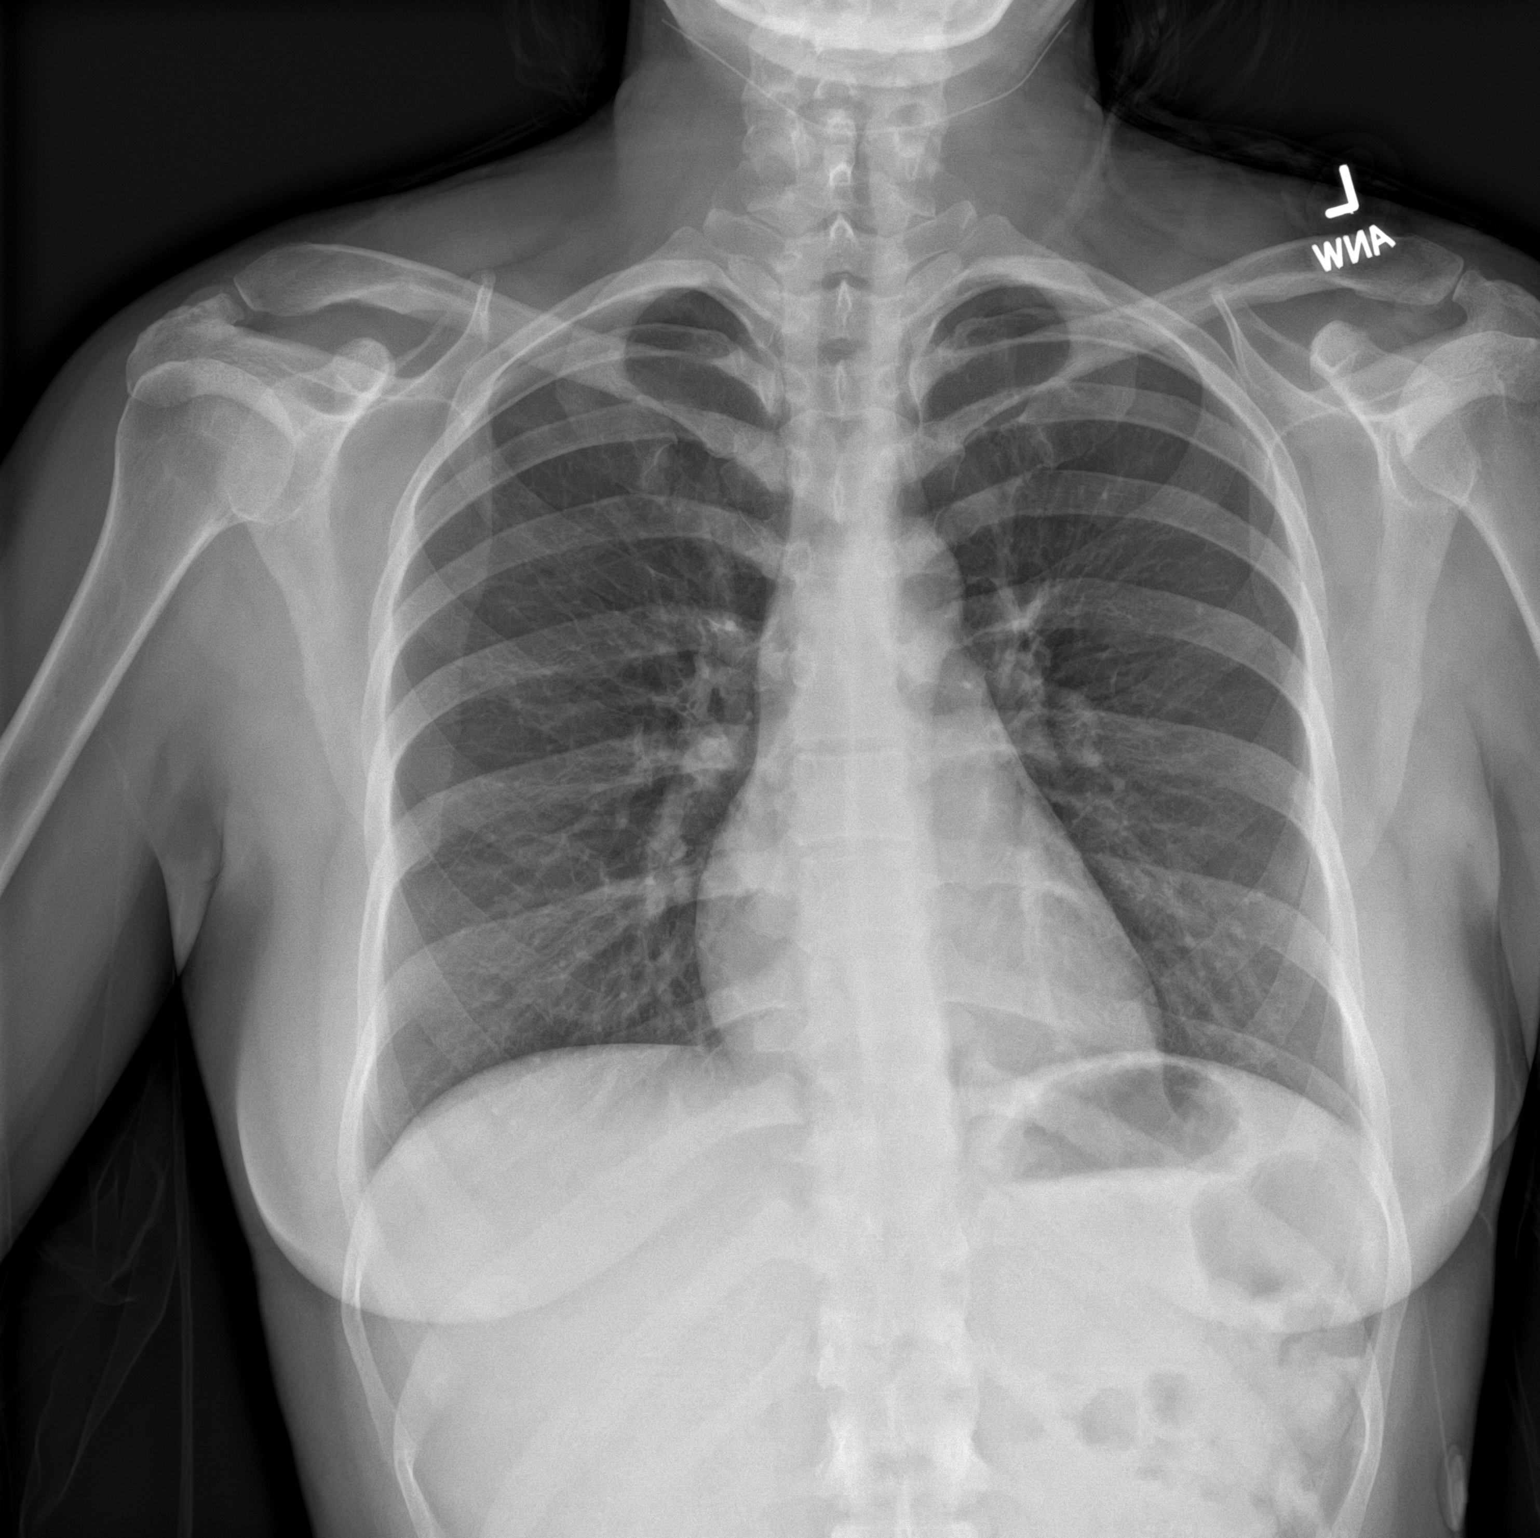

[chest lat]
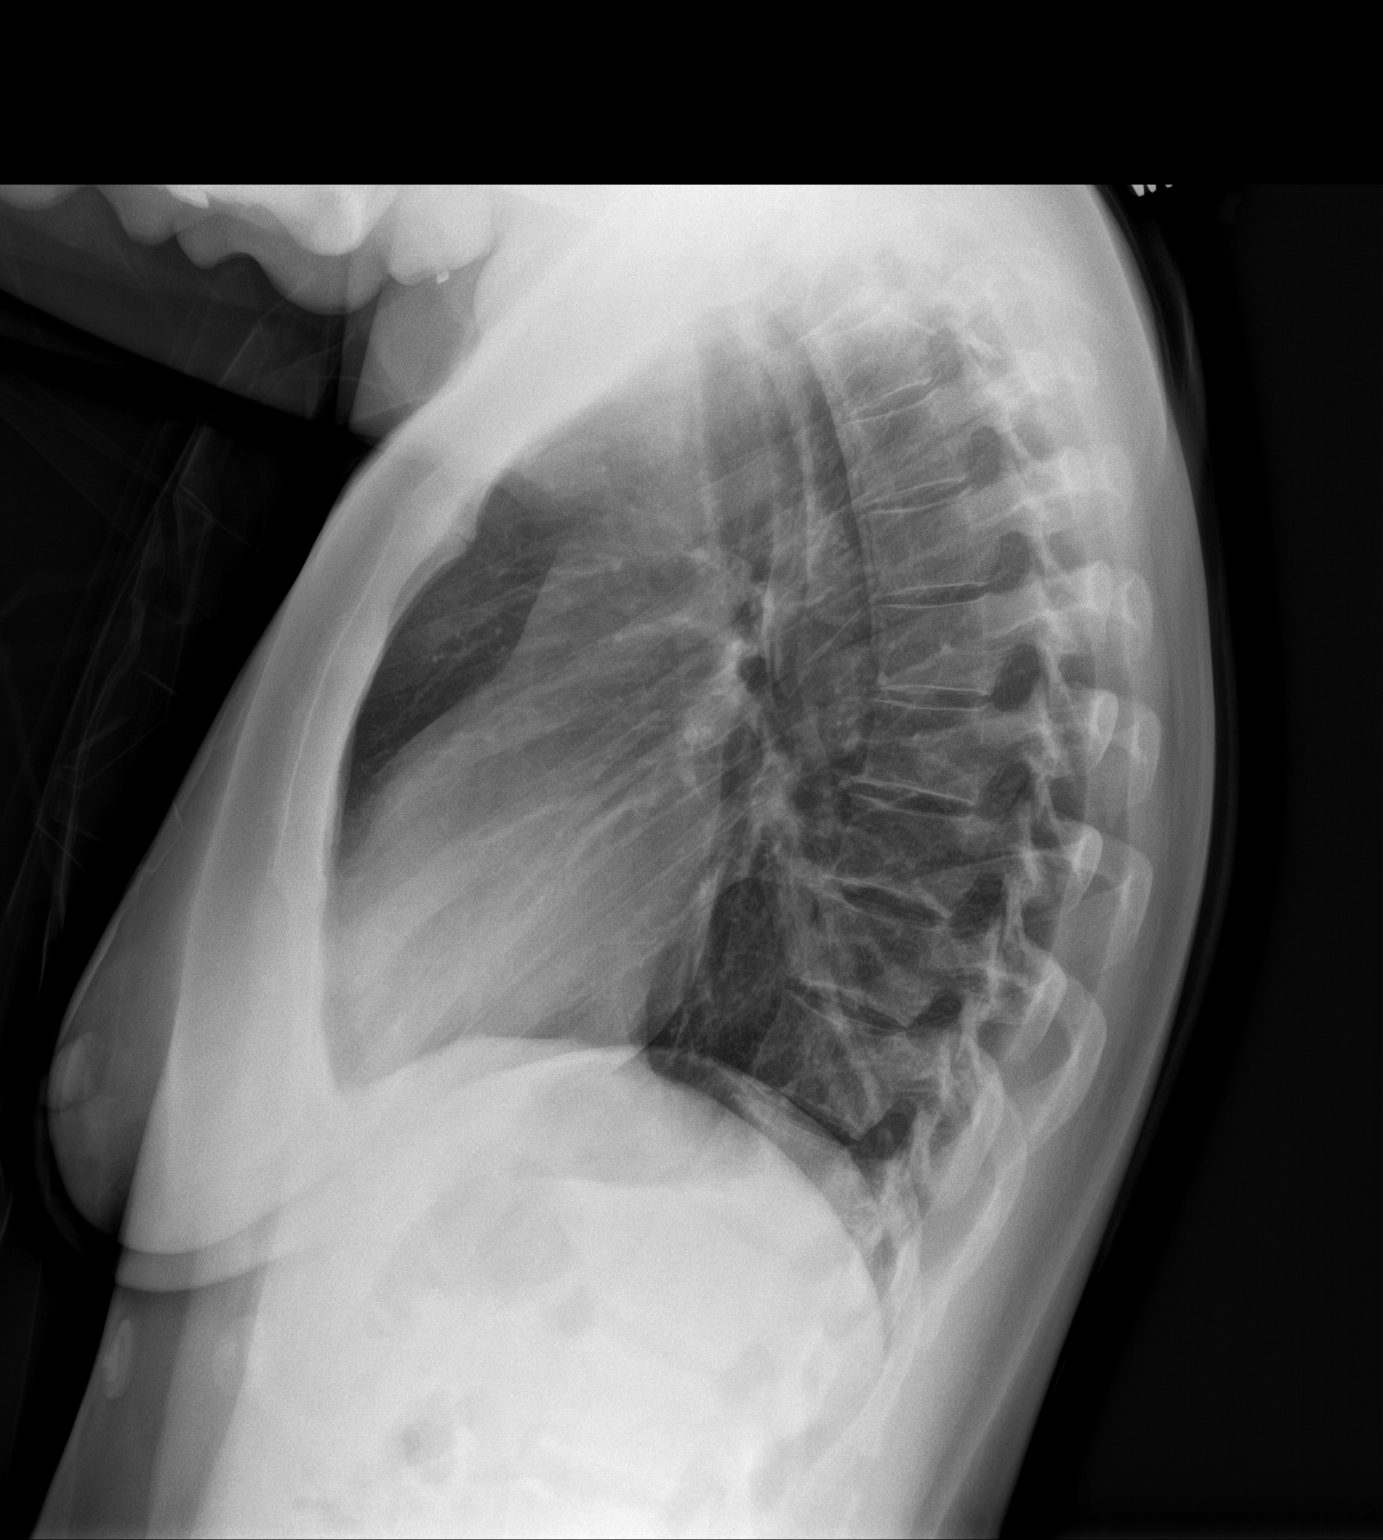

[2 of 2 positions shown; findings below may reference images not displayed]

FINDINGS: The heart size and mediastinal contours are within normal limits.
Both lungs are clear. The visualized skeletal structures are
unremarkable.
IMPRESSION: No active cardiopulmonary disease.

## 2023-06-14 ENCOUNTER — Encounter: Payer: Self-pay | Admitting: Plastic Surgery

## 2023-06-14 ENCOUNTER — Ambulatory Visit (INDEPENDENT_AMBULATORY_CARE_PROVIDER_SITE_OTHER): Payer: Medicaid Other | Admitting: Plastic Surgery

## 2023-06-14 VITALS — BP 114/73 | HR 77 | Ht 68.0 in | Wt 155.0 lb

## 2023-06-14 DIAGNOSIS — Z411 Encounter for cosmetic surgery: Secondary | ICD-10-CM

## 2023-06-14 DIAGNOSIS — G40909 Epilepsy, unspecified, not intractable, without status epilepticus: Secondary | ICD-10-CM
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
## 2023-06-27 ENCOUNTER — Encounter: Admitting: Student

## 2023-06-28 ENCOUNTER — Telehealth: Payer: Self-pay

## 2023-06-28 ENCOUNTER — Other Ambulatory Visit

## 2023-06-28 ENCOUNTER — Other Ambulatory Visit (HOSPITAL_COMMUNITY): Payer: Self-pay

## 2023-06-28 ENCOUNTER — Ambulatory Visit: Payer: Self-pay | Admitting: Student

## 2023-06-28 DIAGNOSIS — Z411 Encounter for cosmetic surgery: Secondary | ICD-10-CM

## 2023-06-28 MED ORDER — LIDOCAINE 23% - TETRACAINE 7% TOPICAL OINTMENT (PLASTICIZED)
1.0000 | TOPICAL_OINTMENT | Freq: Once | CUTANEOUS | 0 refills | Status: AC
  Filled 2023-06-28: qty 60, 1d supply, fill #0

## 2023-06-28 MED ORDER — HYDROQUINONE 4 % EX CREA: TOPICAL_CREAM | Freq: Two times a day (BID) | CUTANEOUS | 0 refills | Status: DC

## 2023-06-28 MED ORDER — TRETINOIN 0.025 % EX CREA: TOPICAL_CREAM | Freq: Every day | CUTANEOUS | 0 refills | Status: AC

## 2023-06-28 NOTE — Telephone Encounter (Signed)
 Patient came into the office stating she needed to have some labs done. Talked to Dr. Ty Gales and she stated that is fine and is patient having any symptoms? is this the reason she is wanting to do it today?also check that she has not just taken her Lamictal ? otherwise dose will be high. Ideally, bloodwork is done before she takes her med.   Patient stated no symptoms, and yes she has taken her Lamictal  today. Patient stated that she thought she needed her blood done before her follow up appt. I informed patient that unless she is having symptoms she is not due for labs. Also informed patient that since she took her Lamictal  today she cannot have labs done because levels will be higher.

## 2023-06-28 NOTE — Progress Notes (Signed)
 Patient is a 38 year old female who was seen in the clinic by Dr. Orin Birk on 06/14/2023 for evaluation of her face.  Patient reported that she had facial scarring and that she had acne scarring for years.  She reported that she tried several different types of treatments to improve it.  The contour was rough and asymmetric.  It was noted patient was a Ponce Brisker 5.  Plan was to start her off with low-dose BBL and see how that would help her pigment.  It was recommended that she do that 3-4 times and then give it time for her previous treatments to declare themselves.  Then TRL would be a nice option for her.  Today, patient reports she is doing well.  She states that she mostly wants her acne scars addressed.  She states that she is not as concerned about brown spots.  Patient denies any history of keloids.  Patient denies being pregnant.  She denies any history of immunocompromise conditions.  She denies any history of cold sores.  She denies taking any steroids or Accutane.  Patient does report she has a history of epilepsy, but reports she does not have sensitivity to lights or stroking lights.  She states that her epilepsy is controlled with medications and that she has not had a seizure for 12 years.  We discussed the risks of BBL.  I did discuss with her that given her Ponce Brisker score, she may be at higher risk of burning.  I did discuss with her though that we could set the BBL at very low settings for her if she would like to proceed.  We also discussed other lasers including Moxi and halo.  We discussed downtime with each laser.  After discussing risks and each of the lasers, patient has decided to move forward with Moxi.  I discussed with her that I would like her to be treated with hydroquinone for 2 weeks before and 2 weeks after the laser.  Patient expressed understanding was in agreement with this.  Will plan on Moxi in 2 weeks.  I will go ahead and send in hydroquinone cream for her to start  applying for 2 weeks before the Moxi.  Patient expressed understanding.  Patient also requested tretinoin cream.  I discussed with her that I can send this in for her, but she cannot take it from now until her laser and then not until 2 weeks after her laser.  Patient expressed understanding.  I will also go ahead and send in the numbing cream for her procedure.  Patient expressed understanding.

## 2023-07-01 ENCOUNTER — Telehealth: Payer: Self-pay | Admitting: Student

## 2023-07-01 NOTE — Telephone Encounter (Signed)
 Pt apt on fri has to be r/s due to [place] in sx, if pt agrees to thurs that will work, if not we can move to different day 30th to 29th

## 2023-07-02 ENCOUNTER — Other Ambulatory Visit (HOSPITAL_COMMUNITY): Payer: Self-pay

## 2023-07-11 ENCOUNTER — Ambulatory Visit (INDEPENDENT_AMBULATORY_CARE_PROVIDER_SITE_OTHER): Payer: Self-pay | Admitting: Student

## 2023-07-11 DIAGNOSIS — Z411 Encounter for cosmetic surgery: Secondary | ICD-10-CM

## 2023-07-11 NOTE — Progress Notes (Signed)
 Preoperative Dx: Texture to skin of the face, acne scars  Postoperative Dx:  same  Procedure: laser to face (Moxi)  Anesthesia: Lidocaine  23%, tetracaine  7% topical ointment  Description of Procedure:  Risks and complications were explained to the patient. Consent was confirmed. Time out was called and all information was confirmed to be correct. The area  area was prepped with alcohol and wiped dry.  Moxi laser was set at level 2, at 10.0 mJ, 10% coverage and 6 passes.  The face was lasered.  The patient tolerated the procedure well and there were no complications.  Post procedural instructions were discussed with the patient.  She expressed understanding.  Instructed patient that she is to continue with the hydroquinone  as directed on the bottle.  She expressed understanding.  We will do a telephone visit in about 2 weeks for follow-up.  Will tentatively plan for her to do TRL procedure with Dr. Orin Birk in 6 weeks.  Patient expressed understanding was in agreement with this plan.

## 2023-07-12 ENCOUNTER — Other Ambulatory Visit: Admitting: Student

## 2023-07-12 ENCOUNTER — Other Ambulatory Visit (HOSPITAL_COMMUNITY): Payer: Self-pay

## 2023-07-16 ENCOUNTER — Ambulatory Visit (INDEPENDENT_AMBULATORY_CARE_PROVIDER_SITE_OTHER): Admitting: Neurology

## 2023-07-16 ENCOUNTER — Encounter: Payer: Self-pay | Admitting: Neurology

## 2023-07-16 VITALS — BP 110/72 | HR 66 | Ht 68.0 in | Wt 159.0 lb

## 2023-07-16 DIAGNOSIS — G40309 Generalized idiopathic epilepsy and epileptic syndromes, not intractable, without status epilepticus: Secondary | ICD-10-CM | POA: Diagnosis not present

## 2023-07-16 MED ORDER — LAMOTRIGINE 100 MG PO TABS: 100.0000 mg | ORAL_TABLET | Freq: Two times a day (BID) | ORAL | 4 refills | Status: DC

## 2023-07-16 NOTE — Patient Instructions (Signed)
 Good to see you doing well.  Take Lamotrigine  150mg  tablet in AM, 100mg  tablet in PM for 2 weeks, then stop the 150mg  tablet and take the 100mg  tablet in AM and in PM. After 2 weeks on this new dose, check Lamictal  level  2. Follow-up in 5 months, call for any changes   Seizure Precautions: 1. If medication has been prescribed for you to prevent seizures, take it exactly as directed.  Do not stop taking the medicine without talking to your doctor first, even if you have not had a seizure in a long time.   2. Avoid activities in which a seizure would cause danger to yourself or to others.  Don't operate dangerous machinery, swim alone, or climb in high or dangerous places, such as on ladders, roofs, or girders.  Do not drive unless your doctor says you may.  3. If you have any warning that you may have a seizure, lay down in a safe place where you can't hurt yourself.    4.  No driving for 6 months from last seizure, as per [place]  state law.   Please refer to the following link on the [employer]ile[employer] of [employer]'s website for more information: http://www.epilepsyfoundation.org/answerplace/Social/driving/drivingu.cfm   5.  Maintain good sleep hygiene. Avoid alcohol  6.  Notify your neurology if you are planning pregnancy or if you become pregnant.  7.  Contact your doctor if you have any problems that may be related to the medicine you are taking.  8.  Call 911 and bring the patient back to the ED if:        A.  The seizure lasts longer than 5 minutes.       B.  The patient doesn't awaken shortly after the seizure  C.  The patient has new problems such as difficulty seeing, speaking or moving  D.  The patient was injured during the seizure  E.  The patient has a temperature over 102 F (39C)  F.  The patient vomited and now is having trouble breathing
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
## 2023-07-25 ENCOUNTER — Telehealth: Admitting: Student

## 2023-07-25 DIAGNOSIS — Z411 Encounter for cosmetic surgery: Secondary | ICD-10-CM

## 2023-07-25 MED ORDER — HYDROQUINONE 4 % EX CREA: TOPICAL_CREAM | Freq: Two times a day (BID) | CUTANEOUS | 1 refills | Status: DC

## 2023-07-25 NOTE — Progress Notes (Signed)
 Patient is a 38 year old female with history of acne scars.  She most recently underwent Moxi laser to the face.  She presents today for telephone visit for follow-up.  Patient reports that she is doing well.  She states that she is very happy with the results so far.  She states that she has been doing well from a recovery standpoint.  She does not report any issues or concerns.  Patient states that she is booked for a TRL on July 15.  I recommended that she make sure that she start hydroquinone  at least 2 weeks prior to that procedure and take as recommended on the bottle as she did prior to her Moxi.  Patient expressed understanding.

## 2023-07-30 ENCOUNTER — Encounter: Payer: Self-pay | Admitting: Neurology

## 2023-07-31 ENCOUNTER — Encounter: Payer: Self-pay | Admitting: Neurology

## 2023-08-27 ENCOUNTER — Encounter: Payer: Self-pay | Admitting: Plastic Surgery

## 2023-08-27 ENCOUNTER — Other Ambulatory Visit (HOSPITAL_COMMUNITY): Payer: Self-pay

## 2023-08-27 ENCOUNTER — Ambulatory Visit (INDEPENDENT_AMBULATORY_CARE_PROVIDER_SITE_OTHER): Payer: Self-pay | Admitting: Plastic Surgery

## 2023-08-27 DIAGNOSIS — J34829 Nasal valve collapse, unspecified: Secondary | ICD-10-CM | POA: Insufficient documentation

## 2023-08-27 MED ORDER — LIDOCAINE 23% - TETRACAINE 7% TOPICAL OINTMENT (PLASTICIZED)
1.0000 | TOPICAL_OINTMENT | Freq: Once | CUTANEOUS | 0 refills | Status: AC
  Filled 2023-08-27: qty 60, 1d supply, fill #0

## 2023-08-27 NOTE — Progress Notes (Signed)
 The patient came for TRL but did not come early so I was unable to put the numbing medicine on.  I offered to do the TRL at lower amount but she did get nervous so she decided she wanted to wait.  The patient also complains of trouble with breathing and collapse of her valves.  I will go ahead and set up a consult with Dr. Eleanor show for evaluation of her nose.

## 2023-08-29 ENCOUNTER — Other Ambulatory Visit: Admitting: Student

## 2023-08-30 ENCOUNTER — Encounter: Payer: Self-pay | Admitting: Nurse Practitioner

## 2023-09-02 ENCOUNTER — Ambulatory Visit: Payer: Self-pay

## 2023-09-02 DIAGNOSIS — E039 Hypothyroidism, unspecified: Secondary | ICD-10-CM

## 2023-09-02 DIAGNOSIS — D509 Iron deficiency anemia, unspecified: Secondary | ICD-10-CM

## 2023-09-02 DIAGNOSIS — F419 Anxiety disorder, unspecified: Secondary | ICD-10-CM

## 2023-09-02 DIAGNOSIS — E559 Vitamin D deficiency, unspecified: Secondary | ICD-10-CM

## 2023-09-02 DIAGNOSIS — G40909 Epilepsy, unspecified, not intractable, without status epilepticus: Secondary | ICD-10-CM

## 2023-09-02 NOTE — Telephone Encounter (Signed)
 FYI Only or Action Required?: FYI only for provider.  Patient was last seen in primary care on 01/04/2023 by Early, Camie BRAVO, NP.  Called Nurse Triage reporting Fatigue.  Symptoms began several weeks ago.  Interventions attempted: Nothing.Ran out of Thyroid  medication and Vitamin D   Symptoms are: gradually worsening.  Triage Disposition: See PCP When Office is Open (Within 3 Days)  Patient/caregiver understands and will follow disposition?: Yes   Copied from CRM [id number]. Topic: Clinical - Red Word Triage >> Sep 02, 2023  3:58 PM Donna BRAVO wrote: Red Word that prompted transfer to Nurse Triage: patient out of thyroid  medication, out of thyroid  medication for 2 months, very tired   Patient calling requesting appt for medication refill patient states very tired and refused to speak with nurse triage patient requesting lab for Vitamine D and thyroid  before appt is scheduled Reason for Disposition  [1] Fatigue (i.e., tires easily, decreased energy) AND [2] persists > 1 week  Answer Assessment - Initial Assessment Questions 1. DESCRIPTION: Describe how you are feeling.     Tired and headaches  2. SEVERITY: How bad is it?  Can you stand and walk?     Yes  3. ONSET: When did these symptoms begin? (e.g., hours, days, weeks, months)     2-3 weeks ago  4. CAUSE: What do you think is causing the weakness or fatigue? (e.g., not drinking enough fluids, medical problem, trouble sleeping)     Not taking thyroid  and vitamin  5. NEW MEDICINES:  Have you started on any new medicines recently? (e.g., opioid pain medicines, benzodiazepines, muscle relaxants, antidepressants, antihistamines, neuroleptics, beta blockers)     No  6. OTHER SYMPTOMS: Do you have any other symptoms? (e.g., chest pain, fever, cough, SOB, vomiting, diarrhea, bleeding, other areas of pain)     Hair loss  7. PREGNANCY: Is there any chance you are pregnant? When was your last menstrual period?     N0,  LMP 6/26  Protocols used: Weakness (Generalized) and Fatigue-A-AH

## 2023-09-04 ENCOUNTER — Ambulatory Visit: Admitting: Medical

## 2023-09-04 VITALS — BP 110/70 | HR 66 | Wt 162.6 lb

## 2023-09-04 DIAGNOSIS — Z1231 Encounter for screening mammogram for malignant neoplasm of breast: Secondary | ICD-10-CM

## 2023-09-04 DIAGNOSIS — G40909 Epilepsy, unspecified, not intractable, without status epilepticus: Secondary | ICD-10-CM

## 2023-09-04 DIAGNOSIS — E559 Vitamin D deficiency, unspecified: Secondary | ICD-10-CM | POA: Diagnosis not present

## 2023-09-04 DIAGNOSIS — R7989 Other specified abnormal findings of blood chemistry: Secondary | ICD-10-CM

## 2023-09-04 DIAGNOSIS — Z021 Encounter for pre-employment examination: Secondary | ICD-10-CM | POA: Diagnosis not present

## 2023-09-04 DIAGNOSIS — D509 Iron deficiency anemia, unspecified: Secondary | ICD-10-CM | POA: Diagnosis not present

## 2023-09-04 DIAGNOSIS — L659 Nonscarring hair loss, unspecified: Secondary | ICD-10-CM

## 2023-09-04 MED ORDER — VITAMIN D (ERGOCALCIFEROL) 1.25 MG (50000 UNIT) PO CAPS
50000.0000 [IU] | ORAL_CAPSULE | ORAL | 3 refills | Status: AC
Start: 2023-09-04 — End: ?

## 2023-09-04 NOTE — Patient Instructions (Signed)
Please call to schedule your mammogram.   [facility]  [phone] [address], [address] [place], [place] [address]  
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
## 2023-09-05 ENCOUNTER — Other Ambulatory Visit (HOSPITAL_COMMUNITY): Payer: Self-pay

## 2023-09-05 ENCOUNTER — Ambulatory Visit: Payer: Self-pay | Admitting: Medical

## 2023-09-05 LAB — IRON,TIBC AND FERRITIN PANEL
Ferritin: 12 ng/mL — AB (ref 15–150)
Iron Saturation: 24 (ref 15–55)
Iron: 102 ug/dL (ref 27–159)
Total Iron Binding Capacity: 427 ug/dL (ref 250–450)
UIBC: 325 ug/dL (ref 131–425)

## 2023-09-05 LAB — BASIC METABOLIC PANEL WITH GFR
BUN/Creatinine Ratio: 19 (ref 9–23)
BUN: 12 mg/dL (ref 6–24)
CO2: 19 mmol/L — ABNORMAL LOW (ref 20–29)
Calcium: 8.7 mg/dL (ref 8.7–10.2)
Chloride: 104 mmol/L (ref 96–106)
Creatinine, Ser: 0.62 mg/dL (ref 0.57–1.00)
Glucose: 87 mg/dL (ref 70–99)
Potassium: 4.1 mmol/L (ref 3.5–5.2)
Sodium: 138 mmol/L (ref 134–144)
eGFR: 115 mL/min/1.73 (ref >59)

## 2023-09-05 LAB — TSH+FREE T4
Free T4: 1.17 ng/dL (ref 0.82–1.77)
TSH: 6.39 u[IU]/mL — AB (ref 0.450–4.500)

## 2023-09-05 NOTE — Progress Notes (Signed)
Results through My Chart

## 2023-09-16 ENCOUNTER — Telehealth: Payer: Self-pay | Admitting: Neurology

## 2023-09-16 NOTE — Telephone Encounter (Signed)
 Pt. has not paid, but dropped off [place]  [employer] form in box

## 2023-09-17 ENCOUNTER — Encounter (HOSPITAL_COMMUNITY): Payer: Self-pay

## 2023-09-17 ENCOUNTER — Other Ambulatory Visit (HOSPITAL_COMMUNITY): Payer: Self-pay

## 2023-09-17 ENCOUNTER — Other Ambulatory Visit: Payer: Self-pay | Admitting: Student

## 2023-09-17 ENCOUNTER — Encounter: Payer: Self-pay | Admitting: Nurse Practitioner

## 2023-09-17 DIAGNOSIS — E039 Hypothyroidism, unspecified: Secondary | ICD-10-CM

## 2023-09-17 MED ORDER — HYDROQUINONE 4 % EX CREA
TOPICAL_CREAM | Freq: Two times a day (BID) | CUTANEOUS | 0 refills | Status: DC
  Filled 2023-09-17 – 2023-09-27 (×3): qty 28.35, 30d supply, fill #0

## 2023-09-17 MED ORDER — LIDOCAINE 23% - TETRACAINE 7% TOPICAL OINTMENT (PLASTICIZED)
1.0000 | TOPICAL_OINTMENT | Freq: Once | CUTANEOUS | 0 refills | Status: AC
  Filled 2023-09-17 – 2023-10-03 (×2): qty 60, 1d supply, fill #0

## 2023-09-17 NOTE — Progress Notes (Signed)
 Numbing cream and hydroquinone  for upcoming TRL laser

## 2023-09-24 ENCOUNTER — Ambulatory Visit: Attending: Medical | Admitting: Audiologist

## 2023-09-24 DIAGNOSIS — H9193 Unspecified hearing loss, bilateral: Secondary | ICD-10-CM | POA: Insufficient documentation

## 2023-09-25 NOTE — Telephone Encounter (Signed)
 Please call to schedule lab only for TPO. Orders placed.

## 2023-09-26 ENCOUNTER — Telehealth: Payer: Self-pay | Admitting: Neurology

## 2023-09-26 ENCOUNTER — Other Ambulatory Visit (HOSPITAL_COMMUNITY): Payer: Self-pay

## 2023-09-26 ENCOUNTER — Ambulatory Visit: Admitting: Audiologist

## 2023-09-26 DIAGNOSIS — H9193 Unspecified hearing loss, bilateral: Secondary | ICD-10-CM

## 2023-09-26 NOTE — Procedures (Signed)
  Outpatient Audiology and [facility] [address] [place], [place]  [address] [phone]  AUDIOLOGICAL  EVALUATION  NAME: Margaret [name]     DOB:   [birth year]      MRN: [id number]                                                                                     DATE: 09/26/2023     REFERENT: Oris Camie BRAVO, NP STATUS: Outpatient DIAGNOSIS: Normal Hearing   History: [name] was seen for an audiological evaluation as required by her employer. She has no concerns for her hearing.  Medical history shows no risk for hearing loss.   Evaluation:  Otoscopy showed a clear view of the tympanic membranes, bilaterally Tympanometry results were consistent with normal middle ear function, bilaterally   Audiometric testing was completed using Conventional Audiometry techniques with insert earphones and supraural headphones. Test results are consistent with normal hearing bilaterally. Speech Recognition Thresholds were obtained at 15dB HL in the right ear and at 15dB HL in the left ear. Word Recognition Testing was completed at  40dB SL and [name] scored 100% in each ear.   SIN testing performed according to employer requirements. She scored 100% using NU6 at 50dB with 0 degree azimuth NBN noise at 45dB.   Results:  The test results were reviewed with [name]. She has normal hearing. All forms fill out by audiologist.  [name] printed and provided to [name].    Recommendations: No further testing is recommended at this time, unless hearing difficulties are observed further audiological testing is recommended.          18 minutes spent testing and counseling on results.   If you have any questions please feel free to contact me at [phone].  [name] Stalnaker Au.D.  Audiologist   09/26/2023  3:44 PM  Cc: Early, Sara E, NP

## 2023-09-26 NOTE — Telephone Encounter (Signed)
 Left a message with the after hour service  on 09-26-23   Caller states that she need to check the status of the the forms. She needs the form back by 09-29-23 for her job

## 2023-09-27 ENCOUNTER — Other Ambulatory Visit (HOSPITAL_BASED_OUTPATIENT_CLINIC_OR_DEPARTMENT_OTHER): Payer: Self-pay

## 2023-09-27 ENCOUNTER — Other Ambulatory Visit

## 2023-09-27 DIAGNOSIS — E039 Hypothyroidism, unspecified: Secondary | ICD-10-CM

## 2023-09-27 DIAGNOSIS — Z0279 Encounter for issue of other medical certificate: Secondary | ICD-10-CM

## 2023-09-27 NOTE — Telephone Encounter (Signed)
 Done, this is the one I gave you earlier, thanks!

## 2023-09-27 NOTE — Telephone Encounter (Signed)
 Patient is on the way to pick these up.

## 2023-09-28 LAB — THYROID PEROXIDASE ANTIBODY: Thyroperoxidase Ab SerPl-aCnc: <9 [IU]/mL (ref 0–34)

## 2023-09-30 ENCOUNTER — Encounter: Payer: Self-pay | Admitting: Neurology

## 2023-10-01 ENCOUNTER — Telehealth: Payer: Self-pay | Admitting: Nurse Practitioner

## 2023-10-01 ENCOUNTER — Other Ambulatory Visit

## 2023-10-01 NOTE — Telephone Encounter (Signed)
 Pt dropped off a form for Camie Hun to fill out. Was placed in her folder for review.  Please email back to patient if it is able to be filled out. Email on file correct.

## 2023-10-02 ENCOUNTER — Ambulatory Visit: Payer: Self-pay | Admitting: Nurse Practitioner

## 2023-10-03 ENCOUNTER — Other Ambulatory Visit (HOSPITAL_COMMUNITY): Payer: Self-pay

## 2023-10-03 ENCOUNTER — Other Ambulatory Visit (HOSPITAL_BASED_OUTPATIENT_CLINIC_OR_DEPARTMENT_OTHER): Payer: Self-pay

## 2023-10-04 ENCOUNTER — Telehealth: Payer: Self-pay | Admitting: Nurse Practitioner

## 2023-10-04 ENCOUNTER — Ambulatory Visit: Payer: Self-pay | Admitting: Plastic Surgery

## 2023-10-04 DIAGNOSIS — Z411 Encounter for cosmetic surgery: Secondary | ICD-10-CM

## 2023-10-04 NOTE — Telephone Encounter (Signed)
 Called pt and advised Juliene and Camie are out of the office at this time and Juliene will give her a call on Monday.   Copied from CRM #[id number]. Topic: General - Other >> Oct 04, 2023  2:18 PM Fonda T wrote: Reason for CRM: Received call from patient checking on status of form completion that was dropped off at the office on 10/01/23.  Per [employer], form completion can take up to 7-10 business days. Patient informed, requesting to contact office for status.   Patient verbalized understanding. However, Patient is requesting a return call at [phone] with status update or when form is completed and ready.

## 2023-10-04 NOTE — Progress Notes (Signed)
 Preoperative Dx: Facial scaring  Postoperative Dx:  same  Procedure: laser to face   Anesthesia: none  Description of Procedure:  Risks and complications were explained to the patient. Consent was confirmed and signed. Eye protection was placed. Time out was called and all information was confirmed to be correct. The area  area was prepped with alcohol and wiped dry. The TRL laser was set at 25 mm but was too high so we went to 20 mm and the pronox. The face was lasered. The patient tolerated the procedure well and there were no complications. The patient is to follow up in 4 weeks.

## 2023-10-05 LAB — LAMOTRIGINE LEVEL: Lamotrigine Lvl: 2.5 ug/mL (ref 2.5–15.0)

## 2023-10-07 ENCOUNTER — Encounter: Payer: Self-pay | Admitting: Neurology

## 2023-10-07 ENCOUNTER — Ambulatory Visit: Admitting: Neurology

## 2023-10-07 ENCOUNTER — Other Ambulatory Visit: Payer: Self-pay

## 2023-10-07 DIAGNOSIS — G40909 Epilepsy, unspecified, not intractable, without status epilepticus: Secondary | ICD-10-CM

## 2023-10-08 ENCOUNTER — Telehealth: Payer: Self-pay

## 2023-10-08 ENCOUNTER — Other Ambulatory Visit

## 2023-10-08 NOTE — Telephone Encounter (Signed)
 Can you let her know that it would probably make it easier on everyone if she just does the EEG and has that information on hand? Even if it is normal, we write a letter saying that she has not had any seizures and seizures are well controlled. Seizure.Ty scheduled EEG.

## 2023-10-08 NOTE — Telephone Encounter (Signed)
 Paperwork for employment completed and letter written.

## 2023-10-22 ENCOUNTER — Encounter: Payer: Self-pay | Admitting: Nurse Practitioner

## 2023-10-22 ENCOUNTER — Ambulatory Visit: Admitting: Neurology

## 2023-10-22 DIAGNOSIS — G40909 Epilepsy, unspecified, not intractable, without status epilepticus: Secondary | ICD-10-CM | POA: Diagnosis not present

## 2023-10-22 NOTE — Progress Notes (Signed)
 EEG complete and ready for review.

## 2023-10-23 ENCOUNTER — Ambulatory Visit: Admitting: Medical

## 2023-10-23 VITALS — BP 110/64 | HR 68 | Wt 161.6 lb

## 2023-10-23 DIAGNOSIS — L989 Disorder of the skin and subcutaneous tissue, unspecified: Secondary | ICD-10-CM

## 2023-10-23 DIAGNOSIS — L700 Acne vulgaris: Secondary | ICD-10-CM | POA: Diagnosis not present

## 2023-10-23 NOTE — Progress Notes (Signed)
   Name: Margaret [name]   Date of Visit: 10/23/23   Date of last visit with me: 09/04/2023   CHIEF COMPLAINT:  Chief Complaint  Patient presents with   Acute Visit    Spot coming on on side of nose and would like a derm referral       HPI:  Discussed the use of AI scribe software for clinical note transcription with the patient, who gave verbal consent to proceed.  History of Present Illness   Margaret [name] is a 38 year old female who presents with concerns about a bump on her nose and recent skin changes.  She has had a bump on her nose for approximately ten to fifteen years. Recently, a similar bump has developed on the other side of her nose, raising concerns about symmetry and changes in appearance. There is no associated redness, and she has not been diagnosed with rosacea. She has not previously consulted a dermatologist for this condition.  She underwent a laser treatment at [facility] about twenty days ago, on August 21st or 22nd, 2025. She had stopped using tretinoin  a month ago in preparation for the laser treatment. In the past year, she has only undergone laser therapy for her facial skin concerns and has not used any combination antibiotic gels or other treatments.  No redness on the nose.         OBJECTIVE:    BP 110/64   Pulse 68   Wt 161 lb 9.6 oz (73.3 kg)   BMI 24.57 kg/m    Physical Exam      Gen: wd, wn ,nad Skin: bilat nasal fold with raised skin tissue and some cystic acne, other acne noted of face       ASSESSMENT/PLAN:   Assessment & Plan Skin lesion of face  Cystic acne    Assessment and Plan    Facial skin lesion Lesions on bilat nasal folds, new on left, chronic on right.   - Refer to dermatologist for evaluation.  Status post recent facial laser therapy Recent laser therapy by plastic surgery. Post-procedure care includes mild skincare regimen. - Continue mild skincare regimen as advised. - Resume tretinoin  in 6-8  weeks post-procedure once plastic surgery allows.       [facility]

## 2023-10-29 ENCOUNTER — Encounter: Payer: Self-pay | Admitting: Neurology

## 2023-10-29 NOTE — Procedures (Signed)
 ELECTROENCEPHALOGRAM REPORT  Date of Study: 10/22/2023  Patient's Name: Margaret [name] MRN: [id number] Date of Birth: 11-20-1985  Referring Provider: Dr. Darice Shivers  Clinical History: This is a 38 year old woman with a history of generalized epilepsy, seizure-free since 2012. EEG to guide medication management  Medications: Lamotrigine   Technical Summary: A multichannel digital EEG recording measured by the international 10-20 system with electrodes applied with paste and impedances below 5000 ohms performed in our laboratory with EKG monitoring in an awake and drowsy patient.  Hyperventilation and photic stimulation were performed.  The digital EEG was referentially recorded, reformatted, and digitally filtered in a variety of bipolar and referential montages for optimal display.    Description: The patient is awake and drowsy during the recording.  During maximal wakefulness, there is a symmetric, medium voltage 10 Hz posterior dominant rhythm that attenuates with eye opening.  The record is symmetric.  During drowsiness, there is an increase in theta slowing of the background with 1-2 second bursts of higher voltage 4-5 Hz theta activity. Sleep was not captured. Hyperventilation and photic stimulation did not elicit any abnormalities.  There were no epileptiform discharges or electrographic seizures seen.    EKG lead was unremarkable.  Impression: This awake and drowsy EEG is within normal limits.  Clinical Correlation: A normal EEG does not exclude a clinical diagnosis of epilepsy.  If further clinical questions remain, prolonged EEG may be helpful.  Clinical correlation is advised.   Darice Shivers, M.D.

## 2023-10-30 NOTE — Telephone Encounter (Signed)
 Letter placed at the front office.

## 2023-11-01 ENCOUNTER — Other Ambulatory Visit: Admitting: Plastic Surgery

## 2023-11-07 ENCOUNTER — Encounter: Payer: Self-pay | Admitting: Neurology

## 2023-11-07 ENCOUNTER — Other Ambulatory Visit: Payer: Self-pay | Admitting: Plastic Surgery

## 2023-11-12 ENCOUNTER — Telehealth: Payer: Self-pay | Admitting: Neurology

## 2023-11-12 NOTE — Telephone Encounter (Signed)
 Pt called in this afternoon. Pt stated that she would like a response to her question she lefted on Mychart on 11-07-23. Thanks

## 2023-11-13 NOTE — Telephone Encounter (Signed)
 Pt. Calling again about Mychart message and would like an answer ASAP I'm writing to ask if I can safely undergo rhinoplasty under general anesthesia, given my medical history.

## 2023-11-13 NOTE — Telephone Encounter (Signed)
 Replied to [employer].

## 2023-11-13 NOTE — Telephone Encounter (Signed)
**Note De-identified  [place] Obfuscation** Please advise 

## 2023-11-26 ENCOUNTER — Encounter: Payer: Self-pay | Admitting: Neurology

## 2023-12-17 ENCOUNTER — Encounter: Payer: Self-pay | Admitting: Neurology

## 2023-12-17 ENCOUNTER — Ambulatory Visit: Admitting: Neurology

## 2023-12-17 VITALS — BP 126/83 | HR 84 | Ht 68.0 in | Wt 160.2 lb

## 2023-12-17 DIAGNOSIS — G40309 Generalized idiopathic epilepsy and epileptic syndromes, not intractable, without status epilepticus: Secondary | ICD-10-CM

## 2023-12-17 MED ORDER — LAMOTRIGINE 100 MG PO TABS: 100.0000 mg | ORAL_TABLET | Freq: Two times a day (BID) | ORAL | 4 refills | Status: AC
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
## 2023-12-17 NOTE — Patient Instructions (Addendum)
 Good to see you doing well.  Have bloodwork done for Lamictal  level (before your next dose)  2. Continue Lamotrigine  100mg  twice a day  3. Follow-up in 6 months, call for any changes.  Seizure Precautions: 1. If medication has been prescribed for you to prevent seizures, take it exactly as directed.  Do not stop taking the medicine without talking to your doctor first, even if you have not had a seizure in a long time.   2. Avoid activities in which a seizure would cause danger to yourself or to others.  Don't operate dangerous machinery, swim alone, or climb in high or dangerous places, such as on ladders, roofs, or girders.  Do not drive unless your doctor says you may.  3. If you have any warning that you may have a seizure, lay down in a safe place where you can't hurt yourself.    4.  No driving for 6 months from last seizure, as per [place]  state law.   Please refer to the following link on the Epilepsy Foundation of America's website for more information: http://www.epilepsyfoundation.org/answerplace/Social/driving/drivingu.cfm   5.  Maintain good sleep hygiene. Avoid alcohol.  6.  Notify your neurology if you are planning pregnancy or if you become pregnant.  7.  Contact your doctor if you have any problems that may be related to the medicine you are taking.  8.  Call 911 and bring the patient back to the ED if:        A.  The seizure lasts longer than 5 minutes.       B.  The patient doesn't awaken shortly after the seizure  C.  The patient has new problems such as difficulty seeing, speaking or moving  D.  The patient was injured during the seizure  E.  The patient has a temperature over 102 F (39C)  F.  The patient vomited and now is having trouble breathing

## 2024-01-03 ENCOUNTER — Other Ambulatory Visit

## 2024-01-06 ENCOUNTER — Encounter: Payer: Self-pay | Admitting: Neurology

## 2024-01-06 LAB — LAMOTRIGINE LEVEL: Lamotrigine Lvl: 2.6 ug/mL (ref 2.5–15.0)

## 2024-01-07 ENCOUNTER — Telehealth: Payer: Self-pay

## 2024-01-07 ENCOUNTER — Encounter: Payer: Self-pay | Admitting: Nurse Practitioner

## 2024-01-07 ENCOUNTER — Ambulatory Visit: Payer: Self-pay | Admitting: Neurology

## 2024-01-07 ENCOUNTER — Ambulatory Visit: Admitting: Family Medicine

## 2024-01-07 ENCOUNTER — Encounter: Payer: Self-pay | Admitting: Family Medicine

## 2024-01-07 VITALS — BP 116/72 | HR 72 | Ht 68.0 in | Wt 161.4 lb

## 2024-01-07 DIAGNOSIS — F419 Anxiety disorder, unspecified: Secondary | ICD-10-CM | POA: Diagnosis not present

## 2024-01-07 DIAGNOSIS — G40909 Epilepsy, unspecified, not intractable, without status epilepticus: Secondary | ICD-10-CM

## 2024-01-07 DIAGNOSIS — F32A Depression, unspecified: Secondary | ICD-10-CM

## 2024-01-07 NOTE — Telephone Encounter (Signed)
 I have printed the form off it is a Associate Professor clearance form which will require an appointment. Camie does not have anymore appointments today so she will probably need an appointment with another provider if she needs it done by Friday. If that is ok.    Copied from CRM #[id number]. Topic: General - Other >> Jan 07, 2024 11:07 AM Amber H wrote: Reason for CRM: Patient wanted to know if she could come in and have her provider fill out a medical release form for her pre employment. Form has to be returned by this Friday- 01/10/2024. I called [facility] to verify if the patient needed an appt and I was told possibly however, patient could fax form over due to NP Camie already aware of the medical clearance form. Patient stated she would upload it to mychart.   [id number] [phone]

## 2024-01-07 NOTE — Progress Notes (Signed)
   Subjective:    Patient ID: Margaret [name], female    DOB: 01-25-1986, 38 y.o.   MRN: [id number]  HPI She is here for a preemployment examination.  She will be working for [employer] with some of their immigration issues.  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  She does have a seizure disorder however she has not had any trouble with this since 2012 and is continues to do quite nicely on Lamictal .  She does have a history of anxiety but no trouble at the present time.  She also has a slightly low TSH but is not presently on any medication and seems to be quite stable.   Review of Systems     Objective:    Physical Exam Alert and in no distress. Tympanic membranes and canals are normal. Pharyngeal area is normal. Neck is supple without adenopathy or thyromegaly. Cardiac exam shows a regular sinus rhythm without murmurs or gallops. Lungs are clear to auscultation.        Assessment & Plan:  Seizure disorder (HCC)  Anxiety and depression I signed the appropriate paperwork and cleared her for her new job.

## 2024-01-19 ENCOUNTER — Encounter: Payer: Self-pay | Admitting: Plastic Surgery

## 2024-01-21 ENCOUNTER — Other Ambulatory Visit: Payer: Self-pay | Admitting: Plastic Surgery

## 2024-01-21 ENCOUNTER — Other Ambulatory Visit (HOSPITAL_COMMUNITY): Payer: Self-pay

## 2024-01-21 MED ORDER — LIDOCAINE 23% - TETRACAINE 7% TOPICAL OINTMENT (PLASTICIZED)
1.0000 | TOPICAL_OINTMENT | Freq: Once | CUTANEOUS | 0 refills | Status: AC
  Filled 2024-01-21 (×2): qty 60, 1d supply, fill #0

## 2024-01-22 ENCOUNTER — Other Ambulatory Visit (HOSPITAL_COMMUNITY): Payer: Self-pay

## 2024-01-28 ENCOUNTER — Other Ambulatory Visit: Payer: Self-pay | Admitting: Plastic Surgery

## 2024-01-31 ENCOUNTER — Other Ambulatory Visit (HOSPITAL_COMMUNITY): Payer: Self-pay

## 2024-03-03 ENCOUNTER — Encounter: Payer: Self-pay | Admitting: Plastic Surgery

## 2024-03-04 ENCOUNTER — Other Ambulatory Visit: Payer: Self-pay | Admitting: Student

## 2024-03-04 MED ORDER — HYDROQUINONE 4 % EX CREA: TOPICAL_CREAM | Freq: Two times a day (BID) | CUTANEOUS | 0 refills | Status: AC

## 2024-03-04 NOTE — Progress Notes (Signed)
 Hydroquinone  cream for upcoming laser

## 2024-03-04 NOTE — Progress Notes (Unsigned)
 Mammogram {SEHMDOC:34218}  Margaret Doing, DNP, AGNP-c [facility]  [address] [place], [place] [address] [phone]   ESTABLISHED PATIENT- Chronic Health and/or Follow-Up Visit on 03/06/2024  There were no vitals taken for this visit.   Subjective:  No chief complaint on file.   *** ROS negative except for what is listed in HPI. History, Medications, Surgery, SDOH, and Family History reviewed and updated as appropriate.  Objective:  Physical Exam      Assessment & Plan:   Assessment & Plan    Margaret FORBES Doing, DNP, AGNP-c  {SETIMEYorN (Optional):34216}

## 2024-03-06 ENCOUNTER — Ambulatory Visit: Admitting: Nurse Practitioner

## 2024-03-18 ENCOUNTER — Encounter: Payer: Self-pay | Admitting: Nurse Practitioner

## 2024-04-07 ENCOUNTER — Other Ambulatory Visit: Payer: Self-pay | Admitting: Plastic Surgery

## 2024-07-14 ENCOUNTER — Ambulatory Visit: Admitting: Neurology
